# Patient Record
Sex: Female | Born: 1935 | Race: White | Hispanic: No | Marital: Married | Smoking: Former smoker
Health system: Southern US, Community
[De-identification: ages and names within clinical notes are randomized; demographics above are authoritative.]

## PROBLEM LIST (undated history)

## (undated) ENCOUNTER — Ambulatory Visit (HOSPITAL_BASED_OUTPATIENT_CLINIC_OR_DEPARTMENT_OTHER): Admission: EM | Source: Home / Self Care

## (undated) DIAGNOSIS — N009 Acute nephritic syndrome with unspecified morphologic changes: Secondary | ICD-10-CM

## (undated) DIAGNOSIS — C801 Malignant (primary) neoplasm, unspecified: Secondary | ICD-10-CM

## (undated) DIAGNOSIS — K635 Polyp of colon: Secondary | ICD-10-CM

## (undated) DIAGNOSIS — J381 Polyp of vocal cord and larynx: Secondary | ICD-10-CM

## (undated) DIAGNOSIS — Z800 Family history of malignant neoplasm of digestive organs: Secondary | ICD-10-CM

## (undated) DIAGNOSIS — J449 Chronic obstructive pulmonary disease, unspecified: Secondary | ICD-10-CM

## (undated) DIAGNOSIS — M810 Age-related osteoporosis without current pathological fracture: Secondary | ICD-10-CM

## (undated) DIAGNOSIS — I10 Essential (primary) hypertension: Secondary | ICD-10-CM

## (undated) DIAGNOSIS — I739 Peripheral vascular disease, unspecified: Secondary | ICD-10-CM

## (undated) DIAGNOSIS — E7800 Pure hypercholesterolemia, unspecified: Secondary | ICD-10-CM

## (undated) DIAGNOSIS — N189 Chronic kidney disease, unspecified: Secondary | ICD-10-CM

## (undated) HISTORY — DX: Acute nephritic syndrome with unspecified morphologic changes: N00.9

## (undated) HISTORY — DX: Essential (primary) hypertension: I10

## (undated) HISTORY — DX: Family history of malignant neoplasm of digestive organs: Z80.0

## (undated) HISTORY — DX: Polyp of colon: K63.5

## (undated) HISTORY — DX: Polyp of vocal cord and larynx: J38.1

## (undated) HISTORY — DX: Chronic kidney disease, unspecified: N18.9

## (undated) HISTORY — DX: Chronic obstructive pulmonary disease, unspecified: J44.9

## (undated) HISTORY — DX: Age-related osteoporosis without current pathological fracture: M81.0

## (undated) HISTORY — DX: Peripheral vascular disease, unspecified: I73.9

## (undated) HISTORY — PX: ABDOMINAL HYSTERECTOMY: SHX81

## (undated) HISTORY — PX: WISDOM TOOTH EXTRACTION: SHX21

## (undated) HISTORY — DX: Pure hypercholesterolemia, unspecified: E78.00

## (undated) HISTORY — PX: CATARACT EXTRACTION: SUR2

## (undated) HISTORY — DX: Malignant (primary) neoplasm, unspecified: C80.1

---
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
## 1999-11-17 HISTORY — PX: COLON SURGERY: SHX602

## 2000-04-15 ENCOUNTER — Observation Stay (HOSPITAL_COMMUNITY): Admission: EM | Admit: 2000-04-15 | Discharge: 2000-04-16 | Payer: Self-pay | Admitting: General Surgery

## 2003-11-17 HISTORY — PX: BUNIONECTOMY: SHX129

## 2008-11-16 HISTORY — PX: OTHER SURGICAL HISTORY: SHX169

## 2012-02-15 ENCOUNTER — Encounter: Payer: Self-pay | Admitting: Vascular Surgery

## 2012-02-17 ENCOUNTER — Encounter: Payer: Self-pay | Admitting: Vascular Surgery

## 2012-02-18 ENCOUNTER — Encounter: Payer: Self-pay | Admitting: Vascular Surgery

## 2012-02-18 ENCOUNTER — Ambulatory Visit (INDEPENDENT_AMBULATORY_CARE_PROVIDER_SITE_OTHER): Payer: Medicare Other | Admitting: Vascular Surgery

## 2012-02-18 VITALS — BP 149/49 | HR 62 | Temp 98.0°F | Resp 16 | Ht 61.0 in | Wt 109.0 lb

## 2012-02-18 DIAGNOSIS — I771 Stricture of artery: Secondary | ICD-10-CM | POA: Insufficient documentation

## 2012-02-18 NOTE — Progress Notes (Signed)
[facility] HISTORY AND PHYSICAL   History of Present Illness:  Patient is a 77 y.o. year old female who presents for evaluation of a right subclavian stenosis.  The patient is referred by Dr. Sudie Bailey.  The patient has been asymptomatic from this right subclavian stenosis. She actually noted this on self screening blood pressure. She noted that her right arm pressure was always lower than her left. She denies any symptoms of TIA amaurosis or stroke. She denies any numbness or tingling in her upper extremity is. She has no dizziness symptoms when using her upper extremity is. Other medical problems include tobacco abuse. She currently is not interested in quitting. She currently smokes 5 cigarettes per day. She exercises 2-3 times per week on a treadmill.  Past Medical History  Diagnosis Date  . PVD (peripheral vascular disease)   . Chronic kidney disease 1948    nehpritis     Past Surgical History  Procedure Date  . Appendectomy 1952  . Abdominal hysterectomy   . Colon surgery 2001    tumor removed  . Bunionectomy 2005  . Vocal chord nodules removed 2010     Social History History  Substance Use Topics  . Smoking status: Current Everyday Smoker -- 0.5 packs/day for 50 years    Types: Cigarettes  . Smokeless tobacco: Never Used  . Alcohol Use: Yes    Family History Family History  Problem Relation Age of Onset  . Hypertension Mother   . Heart disease Mother   . Diabetes Father   . Hypertension Father   . Heart disease Father   . Diabetes Paternal Grandmother   . Hypertension Paternal Grandmother   . Heart disease Paternal Grandmother   . Hypertension Paternal Grandfather   . Diabetes Paternal Grandfather   . Heart disease Paternal Grandfather     Allergies  Allergies  Allergen Reactions  . Latex      Current Outpatient Prescriptions  Medication Sig Dispense Refill  . aspirin 81 MG tablet Take 81 mg by mouth daily.      .  calcium-vitamin D (OSCAL WITH D) 500-200 MG-UNIT per tablet Take 3 tablets by mouth daily.      . clobetasol cream (TEMOVATE) 0.05 % Apply topically 2 (two) times daily.      . mometasone (ELOCON) 0.1 % cream Apply topically daily.        ROS:   General:  No weight loss, Fever, chills  HEENT: No recent headaches, no nasal bleeding, no visual changes, no sore throat  Neurologic: No dizziness, blackouts, seizures. No recent symptoms of stroke or mini- stroke. No recent episodes of slurred speech, or temporary blindness.  Cardiac: No recent episodes of chest pain/pressure, no shortness of breath at rest.  No shortness of breath with exertion.  Denies history of atrial fibrillation or irregular heartbeat  Vascular: No history of rest pain in feet.  No history of claudication.  No history of non-healing ulcer, No history of DVT   Pulmonary: No home oxygen, no productive cough, no hemoptysis,  No asthma or wheezing  Musculoskeletal:  [ ]  Arthritis, [ ]  Low back pain,  [ ]  Joint pain  Hematologic:No history of hypercoagulable state.  No history of easy bleeding.  No history of anemia  Gastrointestinal: No hematochezia or melena,  No gastroesophageal reflux, no trouble swallowing  Urinary: [ ]  chronic Kidney disease, [ ]  on HD - [ ]  MWF or [ ]  TTHS, [ ]  Burning with urination, [ ]   Frequent urination, [ ]  Difficulty urinating;   Skin: No rashes  Psychological: No history of anxiety,  No history of depression   Physical Examination  Filed Vitals:   02/18/12 1138  BP: 149/49  Pulse: 62  Temp: 98 F (36.7 C)  TempSrc: Oral  Resp: 16  Height: 5\' 1"  (1.549 m)  Weight: 109 lb (49.442 kg)    Body mass index is 20.60 kg/(m^2).  General:  Alert and oriented, no acute distress HEENT: Normal Neck: No bruit or JVD Pulmonary: Clear to auscultation bilaterally Cardiac: Regular Rate and Rhythm without murmur Abdomen: Soft, non-tender, non-distended, no mass, no scars Skin: No  rash Extremity Pulses:  2+ radial, brachial left side, absent right brachial and radial pulse, hand pink and warm bilaterally, 2+  femoral, dorsalis pedis  bilaterally Musculoskeletal: No deformity or edema  Neurologic: Upper and lower extremity motor 5/5 and symmetric  DATA: I reviewed her carotid duplex scan from [facility] -- [facility] dated 02/09/2012. There is no significant right internal carotid artery stenosis. There is a 40-60% left internal carotid artery stenosis. There is suggestion of a right subclavian artery stenosis based on decreased pressure on the right side as well as monophasic waveforms in the right subclavian artery   ASSESSMENT: Right subclavian artery stenosis, asymptomatic. Moderate left internal carotid artery stenosis, asymptomatic   PLAN:  Agree with Dr. Sudie Bailey that the patient should be on antiplatelet therapy in the form of aspirin. She states that she has been compliant with this. She needs a followup carotid duplex scan in 6 months. She thinks this is being scheduled through [facility].  She should always have her blood pressure measured in her left upper extremity. She will followup on as-needed basis if her screening duplex ultrasound comes greater than 80% in the internal carotid artery. Also, if she develops symptoms of subclavian stenosis which I discussed with her today. Also she will followup sooner she develops symptoms of TIA amaurosis or stroke. She would prefer to have her surveillance carotid duplex scans  for now.  Fabienne Bruns, MD [facility] Office: [phone] Pager: [phone]

## 2015-05-17 DIAGNOSIS — L0390 Cellulitis, unspecified: Secondary | ICD-10-CM | POA: Diagnosis not present

## 2015-06-17 DIAGNOSIS — Z1231 Encounter for screening mammogram for malignant neoplasm of breast: Secondary | ICD-10-CM | POA: Diagnosis not present

## 2015-06-17 DIAGNOSIS — I10 Essential (primary) hypertension: Secondary | ICD-10-CM | POA: Diagnosis not present

## 2015-06-17 DIAGNOSIS — E039 Hypothyroidism, unspecified: Secondary | ICD-10-CM | POA: Diagnosis not present

## 2015-06-17 DIAGNOSIS — E785 Hyperlipidemia, unspecified: Secondary | ICD-10-CM | POA: Diagnosis not present

## 2015-06-17 DIAGNOSIS — Z79899 Other long term (current) drug therapy: Secondary | ICD-10-CM | POA: Diagnosis not present

## 2015-06-17 DIAGNOSIS — B353 Tinea pedis: Secondary | ICD-10-CM | POA: Diagnosis not present

## 2015-06-28 DIAGNOSIS — Z1231 Encounter for screening mammogram for malignant neoplasm of breast: Secondary | ICD-10-CM | POA: Diagnosis not present

## 2015-10-23 DIAGNOSIS — H25813 Combined forms of age-related cataract, bilateral: Secondary | ICD-10-CM | POA: Diagnosis not present

## 2017-05-27 HISTORY — PX: COLONOSCOPY: SHX174

## 2018-08-29 ENCOUNTER — Encounter: Payer: Self-pay | Admitting: Gastroenterology

## 2018-08-31 ENCOUNTER — Encounter: Payer: Self-pay | Admitting: Gastroenterology

## 2018-09-02 ENCOUNTER — Ambulatory Visit: Payer: Medicare Other | Admitting: Gastroenterology

## 2018-09-05 ENCOUNTER — Encounter: Payer: Self-pay | Admitting: Gastroenterology

## 2018-09-06 ENCOUNTER — Ambulatory Visit: Payer: Medicare Other | Admitting: Gastroenterology

## 2018-09-06 ENCOUNTER — Ambulatory Visit (HOSPITAL_BASED_OUTPATIENT_CLINIC_OR_DEPARTMENT_OTHER)
Admission: RE | Admit: 2018-09-06 | Discharge: 2018-09-06 | Disposition: A | Discharge status: Home / Self Care | Payer: Medicare Other | Source: Ambulatory Visit | Attending: Gastroenterology | Admitting: Gastroenterology

## 2018-09-06 ENCOUNTER — Encounter: Payer: Self-pay | Admitting: Gastroenterology

## 2018-09-06 VITALS — BP 136/76 | HR 72 | Ht 61.5 in | Wt 99.0 lb

## 2018-09-06 DIAGNOSIS — R140 Abdominal distension (gaseous): Secondary | ICD-10-CM | POA: Diagnosis not present

## 2018-09-06 DIAGNOSIS — D71 Functional disorders of polymorphonuclear neutrophils: Secondary | ICD-10-CM | POA: Insufficient documentation

## 2018-09-06 DIAGNOSIS — R159 Full incontinence of feces: Secondary | ICD-10-CM | POA: Diagnosis not present

## 2018-09-06 DIAGNOSIS — J449 Chronic obstructive pulmonary disease, unspecified: Secondary | ICD-10-CM | POA: Insufficient documentation

## 2018-09-06 MED ORDER — ALIGN PO CAPS: 1.0000 | ORAL_CAPSULE | Freq: Every day | ORAL | 3 refills | Status: DC

## 2018-09-06 NOTE — Progress Notes (Signed)
Chief Complaint: FU  Referring Provider:  Philemon Kingdom, MD      ASSESSMENT AND PLAN;   #1. Fecal incontinence (has associated urinary incontinence). Neg colon 05/2017 except for mild sigmoid diverticulosis and highly redundant colon. #2. H/O  Bloating. #3. Family history of colon cancer (daughter) #4. 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  No recurrence.  Plan: - X-ray KUB-supine and upright today to r/o spurious diarrhea. - Add calcium 500mg  po qd. - Align 1 tab po qd. - Kegel exercises.  Information given. - If continues to have problems, would consider anorectal manometry with biofeedback. - Follow-up in 12 weeks, earlier in case of any problems.   HPI:    Carmen Lang is a 83 y.o. female  With intermittent diarrhea, associated abdominal bloating and rectal incontinence over the last few months She has previous history of constipation. No melena or hematochezia No abdominal pain or rectal pain Has associated occasional urinary incontinence Denies use of laxatives or excessive fiber intake No recent antibiotics No weight loss   Past Medical History:  Diagnosis Date  . Acute nephritis    around 1948  . Chronic kidney disease 1948   nehpritis   . Colon polyp   . COPD (chronic obstructive pulmonary disease) (HCC)    Borderline  . Elevated cholesterol   . FH: colon cancer   . High blood pressure   . Osteoporosis   . PVD (peripheral vascular disease) (HCC)   . Vocal cord polyp     Past Surgical History:  Procedure Laterality Date  . ABDOMINAL HYSTERECTOMY    . APPENDECTOMY  1952  . BUNIONECTOMY  2005  . COLON SURGERY  2001   tumor removed  . COLONOSCOPY  05/27/2017   Mild sigmoid diverticulosis. OTherwise normal colonoscopy. Colon highly redundant  . vocal chord nodules removed  2010    Family History  Problem Relation Age of Onset  . Hypertension Mother   . Heart disease Mother   . Diabetes Father     . Hypertension Father   . Heart disease Father   . Diabetes Paternal Grandmother   . Hypertension Paternal Grandmother   . Heart disease Paternal Grandmother   . Hypertension Paternal Grandfather   . Diabetes Paternal Grandfather   . Heart disease Paternal Grandfather   . Colon cancer Daughter     Social History   Tobacco Use  . Smoking status: Current Every Day Smoker    Packs/day: 0.50    Years: 50.00    Pack years: 25.00    Types: Cigarettes  . Smokeless tobacco: Never Used  . Tobacco comment: no more than 5 cigarrettes a day  Substance Use Topics  . Alcohol use: Yes    Alcohol/week: 2.0 standard drinks    Types: 2 Glasses of wine per week  . Drug use: No    Current Outpatient Medications  Medication Sig Dispense Refill  . amLODipine (NORVASC) 5 MG tablet daily.    Marland Kitchen aspirin 81 MG tablet Take 81 mg by mouth daily.    Marland Kitchen ezetimibe (ZETIA) 10 MG tablet daily.    Marland Kitchen levothyroxine (SYNTHROID, LEVOTHROID) 25 MCG tablet daily.    . mometasone (ELOCON) 0.1 % cream Apply topically daily.    . solifenacin (VESICARE) 5 MG tablet daily.    . clobetasol cream (TEMOVATE) 0.05 % Apply 1 application topically as needed.      No current facility-administered medications for this visit.     Allergies  Allergen Reactions  . Latex   . Statins Other (See Comments)    Other    Review of Systems:  Negative except for HPI     Physical Exam:    BP 136/76   Pulse 72   Ht 5' 1.5" (1.562 m)   Wt 99 lb (44.9 kg)   BMI 18.40 kg/m  Filed Weights   09/06/18 1449  Weight: 99 lb (44.9 kg)   Constitutional:  Well-developed, in no acute distress. Psychiatric: Normal mood and affect. Behavior is normal. HEENT: Pupils normal.  Conjunctivae are normal. No scleral icterus. Neck supple.  Cardiovascular: Normal rate, regular rhythm. No edema Pulmonary/chest: Bilateral decreased breath sounds. Abdominal: Soft, nondistended. Nontender. Bowel sounds active throughout. There are no  masses palpable. No hepatomegaly. Rectal: Decreased rectal tone, hard stool, heme-negative. Seen in presence of Woodroe Mode CMA Neurological: Alert and oriented to person place and time. Skin: Skin is warm and dry. No rashes noted. 25 minutes spent with the patient today. Greater than 50% was spent in counseling and coordination of care with the patient    Edman Circle, MD 09/06/2018, 3:09 PM  Cc: Philemon Kingdom, MD

## 2018-09-06 NOTE — Patient Instructions (Signed)
If you are age 81 or older, your body mass index should be between 23-30. Your Body mass index is 18.4 kg/m. If this is out of the aforementioned range listed, please consider follow up with your Primary Care Provider.  If you are age 59 or younger, your body mass index should be between 19-25. Your Body mass index is 18.4 kg/m. If this is out of the aformentioned range listed, please consider follow up with your Primary Care Provider.   Your provider has requested that you have an abdominal x ray before leaving today. Please go to the1st floor to our Radiology department for the test.   We have sent the following medications to your pharmacy for you to pick up at your convenience: Align  Please purchase the following medications over the counter and take as directed: Calcium     Kegel exercises: A how-to guide for women Kegel exercises can prevent or control urinary incontinence and other pelvic floor problems. Here's a step-by-step guide to doing Kegel exercises correctly.  Kegel exercises strengthen the pelvic floor muscles, which support the uterus, bladder, small intestine and rectum. You can do Kegel exercises, also known as pelvic floor muscle training, just about anytime. Start by understanding what Kegel exercises can do for you - then follow these instructions for contracting and relaxing your pelvic floor muscles. Why Kegel exercises matter The pelvic floor muscles work like a hammock to support the pelvic organs, including the uterus, bladder and rectum. Kegel exercises can help strengthen these muscles. "  The pelvic floor muscles work like a hammock to support the pelvic organs, including the uterus, bladder and rectum. Kegel exercises can help strengthen these muscles. "  Female pelvic floor muscles  Many factors can weaken your pelvic floor muscles, including pregnancy, childbirth, surgery, aging, excessive straining from constipation or chronic coughing, and being  overweight. You might benefit from doing Kegel exercises if you: Leak a few drops of urine while sneezing, laughing or coughing (stress incontinence)  Have a strong, sudden urge to urinate just before losing a large amount of urine (urinary urge incontinence)  Leak stool (fecal incontinence) Kegel exercises can also be done during pregnancy or after childbirth to try to improve your symptoms. Kegel exercises are less helpful for women who have severe urine leakage when they sneeze, cough or laugh. Also, Kegel exercises aren't helpful for women who unexpectedly leak small amounts of urine due to a full bladder (overflow incontinence). How to do Kegel exercises To get started: Find the right muscles. To identify your pelvic floor muscles, stop urination in midstream. Once you've identified your pelvic floor muscles you can do the exercises in any position, although you might find it easiest to do them lying down at first.  Perfect your technique. To do Kegels, imagine you are sitting on a marble and tighten your pelvic muscles as if you're lifting the marble. Try it for three seconds at a time, then relax for a count of three.  Maintain your focus. For best results, focus on tightening only your pelvic floor muscles. Be careful not to flex the muscles in your abdomen, thighs or buttocks. Avoid holding your breath. Instead, breathe freely during the exercises.  Repeat three times a day. Aim for at least three sets of 10 to 15 repetitions a day. Don't make a habit of using Kegel exercises to start and stop your urine stream. Doing Kegel exercises while emptying your bladder can actually lead to incomplete emptying of the bladder -  which increases the risk of a urinary tract infection. When to do your Kegels Make Kegel exercises part of your daily routine. You can do Kegel exercises discreetly just about any time, whether you're sitting at your desk or relaxing on the couch. When you're having trouble If  you're having trouble doing Kegel exercises, don't be embarrassed to ask for help. Your doctor or other health care provider can give you important feedback so that you learn to isolate and exercise the correct muscles. In some cases, vaginal weighted cones or biofeedback might help. To use a vaginal cone, you insert it into your vagina and use pelvic muscle contractions to hold it in place during your daily activities. During a biofeedback session, your doctor or other health care provider inserts a pressure sensor into your vagina or rectum. As you relax and contract your pelvic floor muscles, a monitor will measure and display your pelvic floor activity. When to expect results If you do Kegel exercises regularly, you can expect results - such as less frequent urine leakage - within about a few weeks to a few months. For continued benefits, make Kegel exercises a permanent part of your daily routine.     Thank you,  Dr. Lynann Bologna

## 2018-09-13 ENCOUNTER — Telehealth: Payer: Self-pay | Admitting: Gastroenterology

## 2018-09-13 NOTE — Telephone Encounter (Signed)
Pharmacy calling regarding prescription for align. They said that in order for insurance to approve prescription we need to contact insurance company.

## 2018-09-13 NOTE — Telephone Encounter (Signed)
I have sent a piror authorization in on this medication.

## 2018-10-04 ENCOUNTER — Telehealth: Payer: Self-pay | Admitting: Gastroenterology

## 2018-10-05 MED ORDER — ALIGN PO CAPS: 1.0000 | ORAL_CAPSULE | Freq: Every day | ORAL | 3 refills | Status: DC

## 2018-10-05 NOTE — Telephone Encounter (Signed)
Sent prescription to patients pharmacy, patient notified. 

## 2019-09-22 IMAGING — DX DG ABDOMEN 2V
3 series · 3 of 3 positions shown · non-contrast
Comparison: None

CLINICAL DATA: Fecal incontinence, bloating, history COPD,
hypertension, chronic kidney disease, smoker

EXAM:
ABDOMEN - 2 VIEW

[abdomen erect]
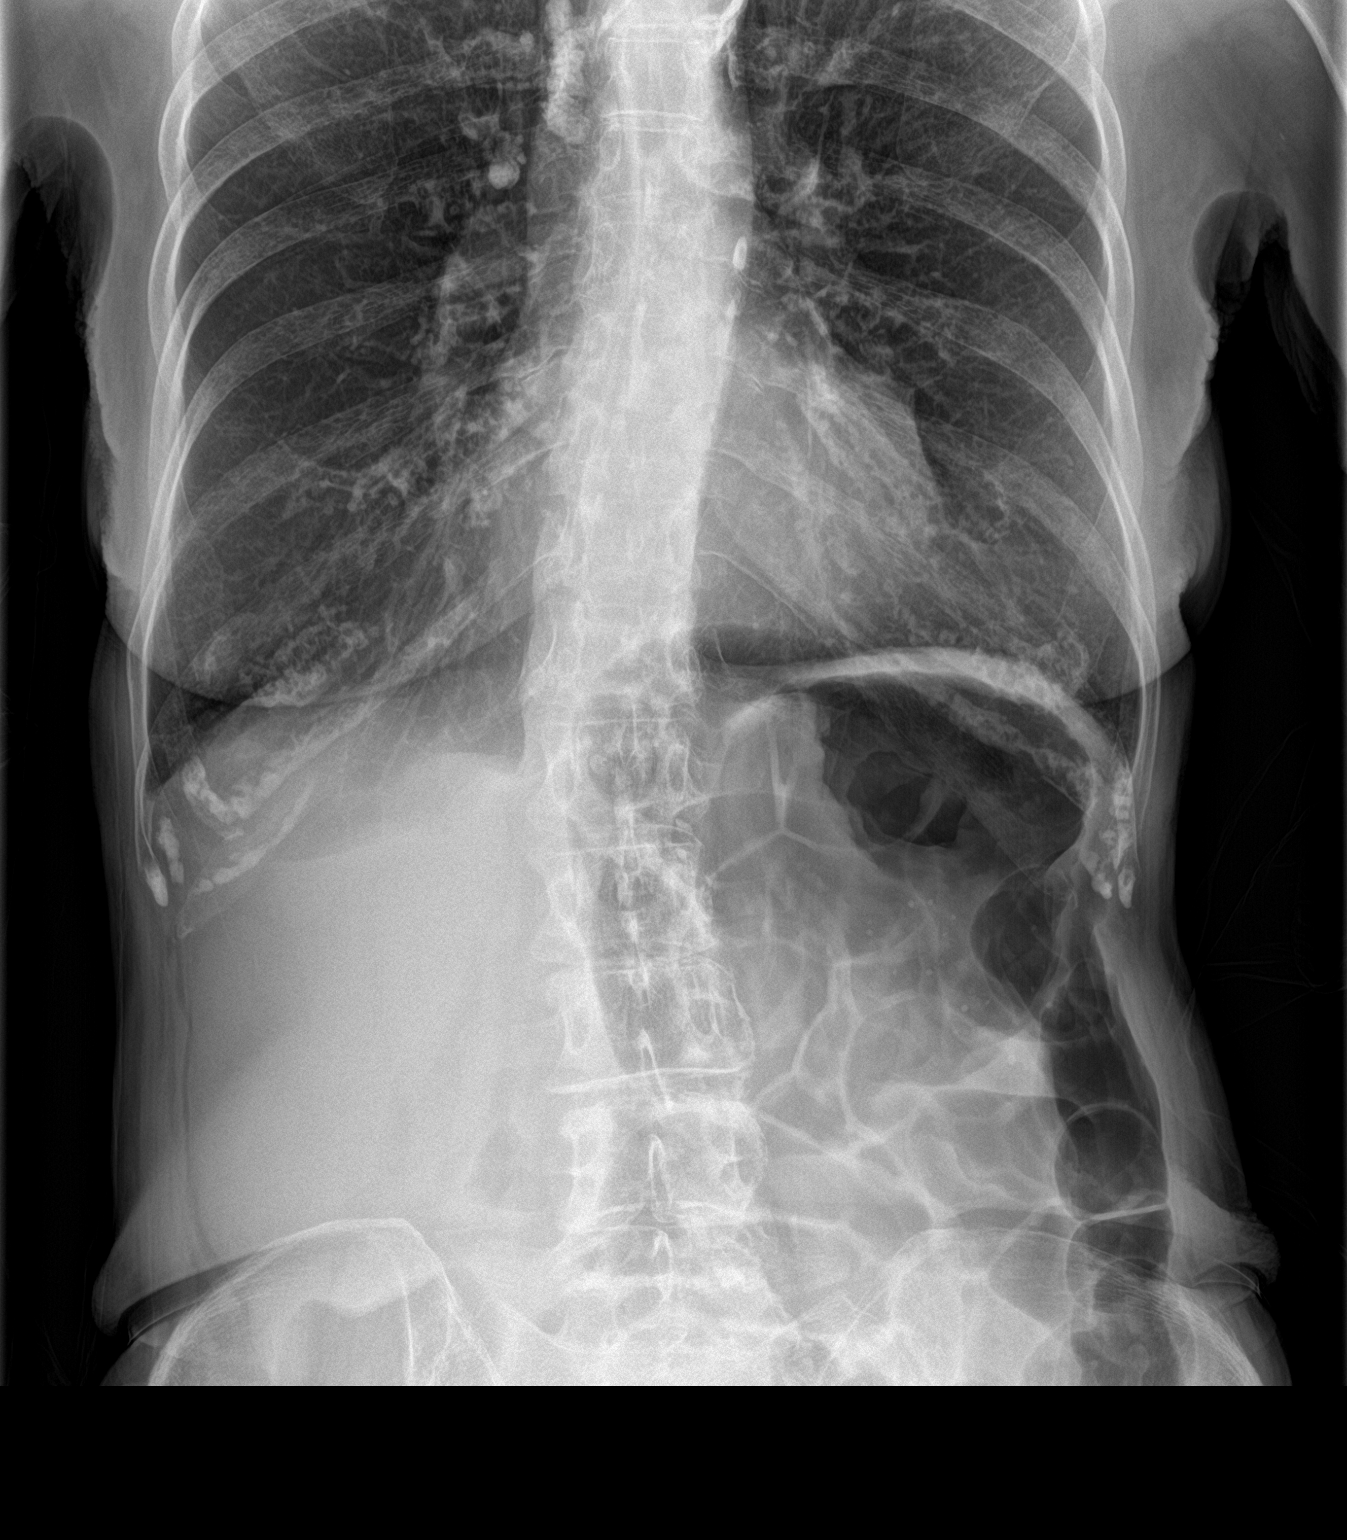

[abdomen supine (1 of 2)]
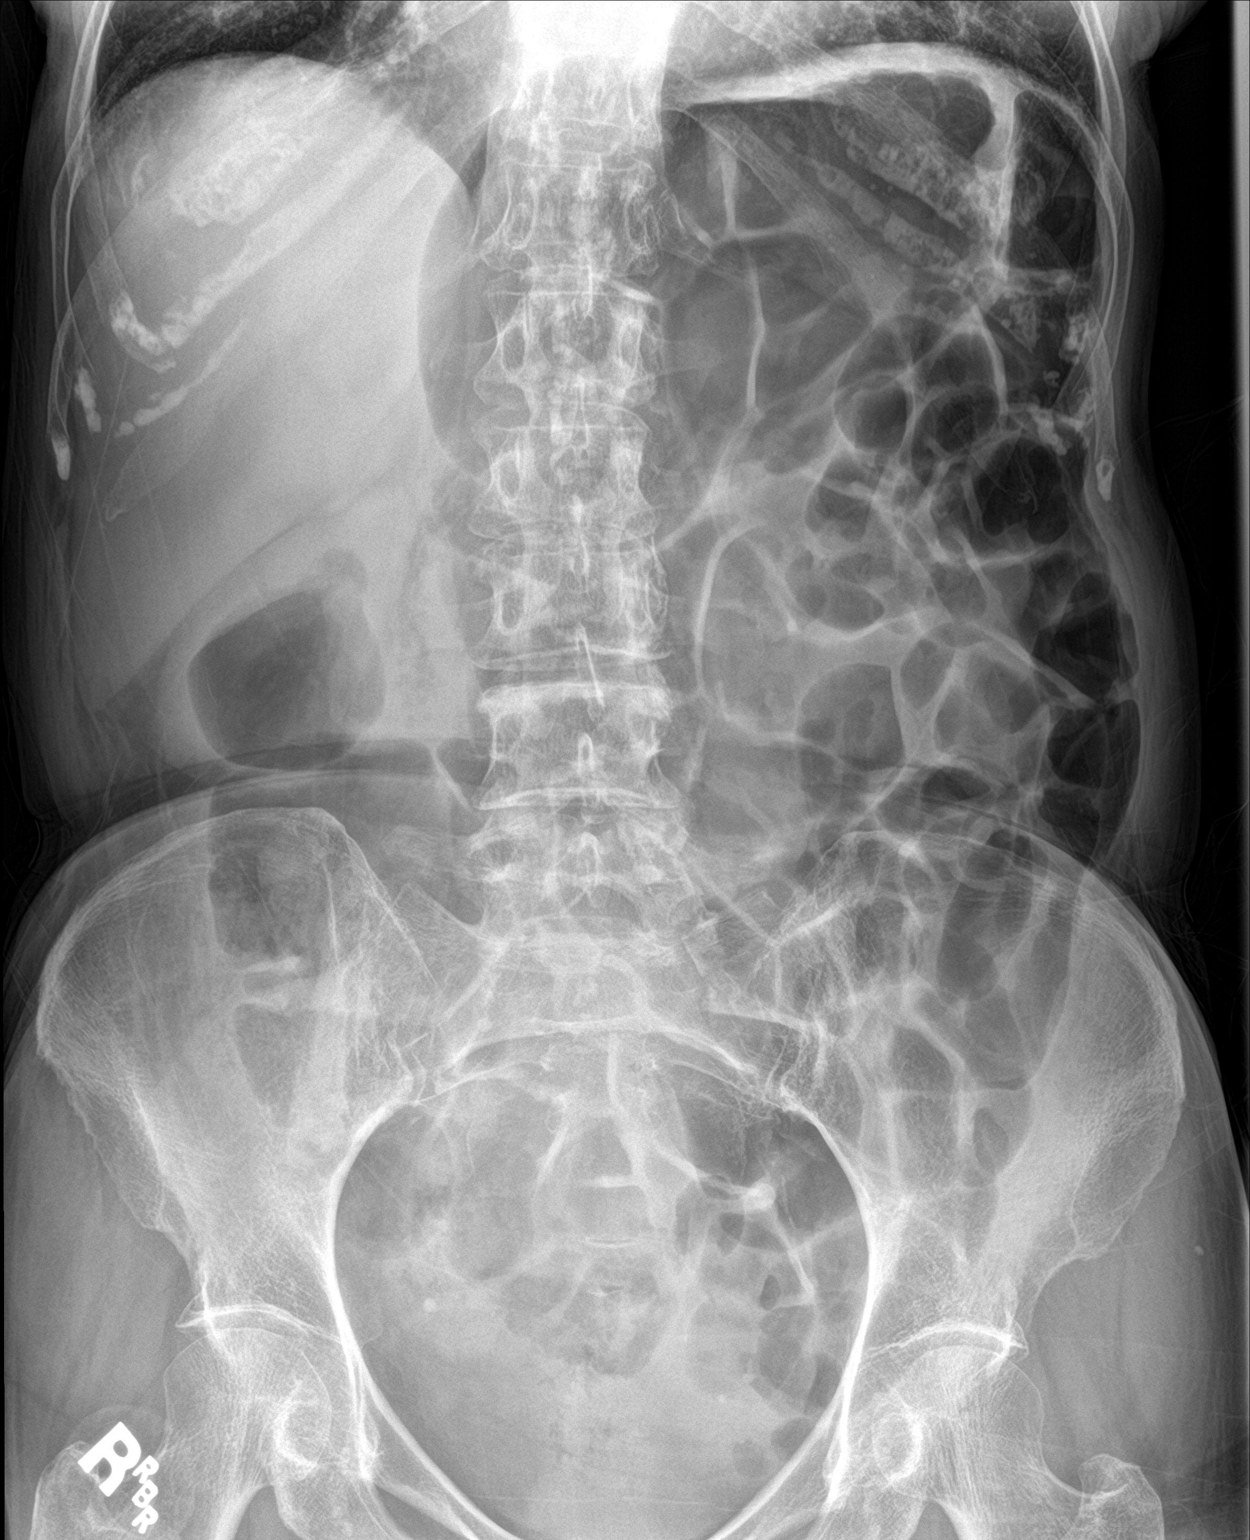

[abdomen supine (2 of 2)]
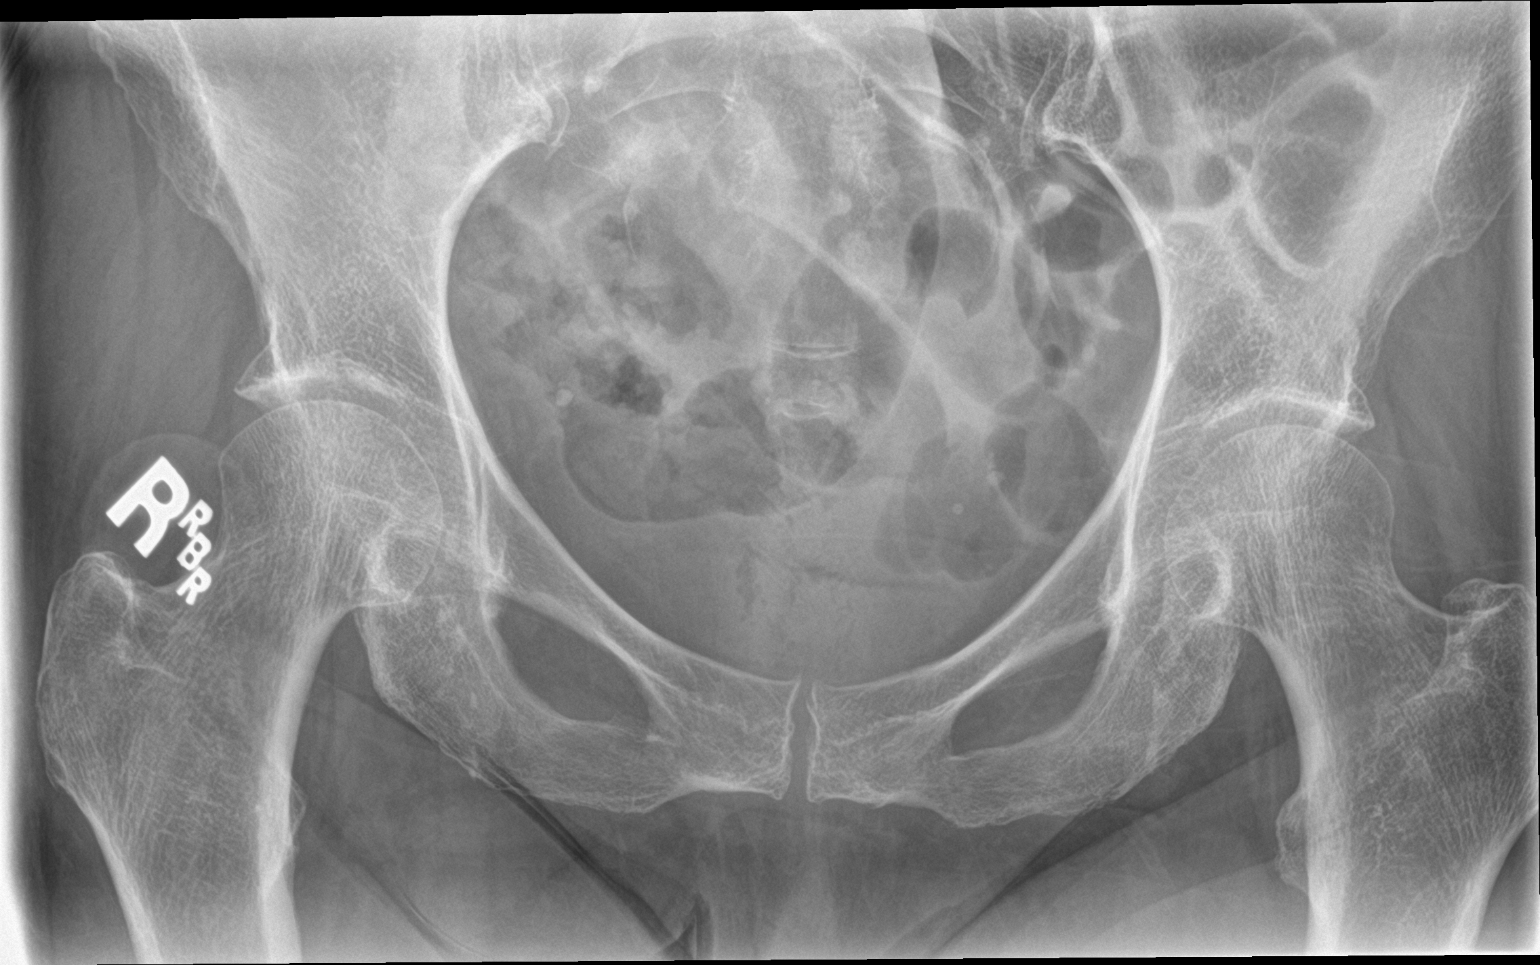

[3 of 3 positions shown; findings below may reference images not displayed]

FINDINGS: Lung bases emphysematous but clear.

Multiple LEFT upper quadrant calcifications question calcified
splenic granulomata.

Air-filled loops of large and small bowel throughout abdomen.

No definite evidence of bowel obstruction, bowel wall thickening or
free air.

Bones demineralized with thoracolumbar scoliosis.

Atherosclerotic calcifications aorta.
IMPRESSION: Nonobstructive bowel gas pattern.

COPD changes without basilar infiltrate.

Old granulomatous disease of the spleen.

## 2019-11-24 DIAGNOSIS — I6523 Occlusion and stenosis of bilateral carotid arteries: Secondary | ICD-10-CM | POA: Diagnosis not present

## 2020-05-23 DIAGNOSIS — E785 Hyperlipidemia, unspecified: Secondary | ICD-10-CM | POA: Diagnosis not present

## 2020-05-23 DIAGNOSIS — R413 Other amnesia: Secondary | ICD-10-CM | POA: Diagnosis not present

## 2020-05-23 DIAGNOSIS — E559 Vitamin D deficiency, unspecified: Secondary | ICD-10-CM | POA: Diagnosis not present

## 2020-05-23 DIAGNOSIS — I10 Essential (primary) hypertension: Secondary | ICD-10-CM | POA: Diagnosis not present

## 2020-05-23 DIAGNOSIS — I739 Peripheral vascular disease, unspecified: Secondary | ICD-10-CM | POA: Diagnosis not present

## 2020-05-23 DIAGNOSIS — M810 Age-related osteoporosis without current pathological fracture: Secondary | ICD-10-CM | POA: Diagnosis not present

## 2020-05-23 DIAGNOSIS — Z79899 Other long term (current) drug therapy: Secondary | ICD-10-CM | POA: Diagnosis not present

## 2020-05-23 DIAGNOSIS — E039 Hypothyroidism, unspecified: Secondary | ICD-10-CM | POA: Diagnosis not present

## 2020-06-12 DIAGNOSIS — I6381 Other cerebral infarction due to occlusion or stenosis of small artery: Secondary | ICD-10-CM | POA: Diagnosis not present

## 2020-06-12 DIAGNOSIS — G319 Degenerative disease of nervous system, unspecified: Secondary | ICD-10-CM | POA: Diagnosis not present

## 2020-06-12 DIAGNOSIS — I6782 Cerebral ischemia: Secondary | ICD-10-CM | POA: Diagnosis not present

## 2020-06-12 DIAGNOSIS — J320 Chronic maxillary sinusitis: Secondary | ICD-10-CM | POA: Diagnosis not present

## 2020-06-12 DIAGNOSIS — R413 Other amnesia: Secondary | ICD-10-CM | POA: Diagnosis not present

## 2020-07-01 DIAGNOSIS — J320 Chronic maxillary sinusitis: Secondary | ICD-10-CM | POA: Diagnosis not present

## 2020-07-01 DIAGNOSIS — R413 Other amnesia: Secondary | ICD-10-CM | POA: Diagnosis not present

## 2020-07-01 DIAGNOSIS — E785 Hyperlipidemia, unspecified: Secondary | ICD-10-CM | POA: Diagnosis not present

## 2020-07-01 DIAGNOSIS — Z1331 Encounter for screening for depression: Secondary | ICD-10-CM | POA: Diagnosis not present

## 2020-09-05 DIAGNOSIS — H35413 Lattice degeneration of retina, bilateral: Secondary | ICD-10-CM | POA: Diagnosis not present

## 2020-09-05 DIAGNOSIS — Z961 Presence of intraocular lens: Secondary | ICD-10-CM | POA: Diagnosis not present

## 2020-12-16 DIAGNOSIS — N3941 Urge incontinence: Secondary | ICD-10-CM | POA: Diagnosis not present

## 2020-12-16 DIAGNOSIS — E039 Hypothyroidism, unspecified: Secondary | ICD-10-CM | POA: Diagnosis not present

## 2020-12-16 DIAGNOSIS — Z79899 Other long term (current) drug therapy: Secondary | ICD-10-CM | POA: Diagnosis not present

## 2020-12-16 DIAGNOSIS — Z0000 Encounter for general adult medical examination without abnormal findings: Secondary | ICD-10-CM | POA: Diagnosis not present

## 2020-12-16 DIAGNOSIS — Z1331 Encounter for screening for depression: Secondary | ICD-10-CM | POA: Diagnosis not present

## 2020-12-16 DIAGNOSIS — Z1339 Encounter for screening examination for other mental health and behavioral disorders: Secondary | ICD-10-CM | POA: Diagnosis not present

## 2020-12-16 DIAGNOSIS — Z1231 Encounter for screening mammogram for malignant neoplasm of breast: Secondary | ICD-10-CM | POA: Diagnosis not present

## 2020-12-16 DIAGNOSIS — E785 Hyperlipidemia, unspecified: Secondary | ICD-10-CM | POA: Diagnosis not present

## 2020-12-16 DIAGNOSIS — L84 Corns and callosities: Secondary | ICD-10-CM | POA: Diagnosis not present

## 2020-12-16 DIAGNOSIS — I10 Essential (primary) hypertension: Secondary | ICD-10-CM | POA: Diagnosis not present

## 2021-01-21 DIAGNOSIS — Z1231 Encounter for screening mammogram for malignant neoplasm of breast: Secondary | ICD-10-CM | POA: Diagnosis not present

## 2021-02-25 DIAGNOSIS — R928 Other abnormal and inconclusive findings on diagnostic imaging of breast: Secondary | ICD-10-CM | POA: Diagnosis not present

## 2021-02-25 DIAGNOSIS — R922 Inconclusive mammogram: Secondary | ICD-10-CM | POA: Diagnosis not present

## 2021-04-15 DIAGNOSIS — N3941 Urge incontinence: Secondary | ICD-10-CM | POA: Diagnosis not present

## 2021-04-15 DIAGNOSIS — I10 Essential (primary) hypertension: Secondary | ICD-10-CM | POA: Diagnosis not present

## 2021-04-15 DIAGNOSIS — E785 Hyperlipidemia, unspecified: Secondary | ICD-10-CM | POA: Diagnosis not present

## 2021-04-15 DIAGNOSIS — E039 Hypothyroidism, unspecified: Secondary | ICD-10-CM | POA: Diagnosis not present

## 2021-04-15 DIAGNOSIS — F0390 Unspecified dementia without behavioral disturbance: Secondary | ICD-10-CM | POA: Diagnosis not present

## 2021-07-07 DIAGNOSIS — K56609 Unspecified intestinal obstruction, unspecified as to partial versus complete obstruction: Secondary | ICD-10-CM | POA: Diagnosis not present

## 2021-07-07 DIAGNOSIS — R109 Unspecified abdominal pain: Secondary | ICD-10-CM | POA: Diagnosis not present

## 2021-07-08 DIAGNOSIS — E039 Hypothyroidism, unspecified: Secondary | ICD-10-CM | POA: Diagnosis not present

## 2021-07-08 DIAGNOSIS — F17210 Nicotine dependence, cigarettes, uncomplicated: Secondary | ICD-10-CM | POA: Diagnosis not present

## 2021-07-08 DIAGNOSIS — D72829 Elevated white blood cell count, unspecified: Secondary | ICD-10-CM | POA: Diagnosis not present

## 2021-07-08 DIAGNOSIS — I10 Essential (primary) hypertension: Secondary | ICD-10-CM | POA: Diagnosis not present

## 2021-07-08 DIAGNOSIS — R109 Unspecified abdominal pain: Secondary | ICD-10-CM | POA: Diagnosis not present

## 2021-07-08 DIAGNOSIS — Z681 Body mass index (BMI) 19 or less, adult: Secondary | ICD-10-CM | POA: Diagnosis not present

## 2021-07-08 DIAGNOSIS — E43 Unspecified severe protein-calorie malnutrition: Secondary | ICD-10-CM | POA: Diagnosis not present

## 2021-07-08 DIAGNOSIS — K56609 Unspecified intestinal obstruction, unspecified as to partial versus complete obstruction: Secondary | ICD-10-CM | POA: Diagnosis not present

## 2021-07-08 DIAGNOSIS — K562 Volvulus: Secondary | ICD-10-CM | POA: Diagnosis not present

## 2021-07-08 DIAGNOSIS — K6389 Other specified diseases of intestine: Secondary | ICD-10-CM | POA: Diagnosis not present

## 2021-07-08 DIAGNOSIS — F0390 Unspecified dementia without behavioral disturbance: Secondary | ICD-10-CM | POA: Diagnosis not present

## 2021-07-08 DIAGNOSIS — E785 Hyperlipidemia, unspecified: Secondary | ICD-10-CM | POA: Diagnosis not present

## 2021-07-16 ENCOUNTER — Other Ambulatory Visit: Payer: Self-pay

## 2021-07-16 NOTE — Patient Outreach (Signed)
[facility]) Care Management  07/16/2021  Anneliese Leblond [birth year] [id number]   Referral Date: 07/16/21 Referral Source: PING  Date of Discharge:07/12/21 Facility: [facility] Insurance: [facility] attempt:No answer.  HIPAA compliant voice message left.     Plan: RN CM will attempt patient again within 4 business days and send letter.   Bary Leriche, RN, MSN [facility] Care Management Care Management Coordinator Direct Line [phone] Toll Free: [phone]  Fax: [phone]

## 2021-07-17 ENCOUNTER — Other Ambulatory Visit: Payer: Self-pay

## 2021-07-17 NOTE — Patient Outreach (Signed)
[facility]) Care Management  07/17/2021  Carmen Lang 09/29/1935 [id number]   Referral Date: 07/16/21 Referral Source: PING  Date of Discharge:07/12/21 Facility: [facility] Insurance: [facility] attempt:No answer.  HIPAA compliant voice message left.       Plan: RN CM will attempt patient again within 4 business days.  Bary Leriche, RN, MSN [facility] Care Management Care Management Coordinator Direct Line [phone] Cell [phone] Toll Free: [phone]  Fax: [phone]

## 2021-07-22 ENCOUNTER — Other Ambulatory Visit: Payer: Self-pay

## 2021-07-22 NOTE — Patient Outreach (Signed)
[facility]) Care Management  07/22/2021  Rollande Thursby 1935-01-18 [id number]   Referral Date: 07/16/21 Referral Source: PING  Date of Discharge:07/12/21 Facility: [facility] Insurance: [facility] attempt:No answer.  HIPAA compliant voice message left.       Plan: RN CM will attempt patient again in 3 weeks.  Bary Leriche, RN, MSN [facility] Care Management Care Management Coordinator Direct Line [phone] Cell [phone] Toll Free: [phone]  Fax: [phone]

## 2021-08-06 DIAGNOSIS — Z79899 Other long term (current) drug therapy: Secondary | ICD-10-CM | POA: Diagnosis not present

## 2021-08-06 DIAGNOSIS — I739 Peripheral vascular disease, unspecified: Secondary | ICD-10-CM | POA: Diagnosis not present

## 2021-08-06 DIAGNOSIS — I10 Essential (primary) hypertension: Secondary | ICD-10-CM | POA: Diagnosis not present

## 2021-08-06 DIAGNOSIS — E039 Hypothyroidism, unspecified: Secondary | ICD-10-CM | POA: Diagnosis not present

## 2021-08-06 DIAGNOSIS — E785 Hyperlipidemia, unspecified: Secondary | ICD-10-CM | POA: Diagnosis not present

## 2021-08-06 DIAGNOSIS — F0390 Unspecified dementia without behavioral disturbance: Secondary | ICD-10-CM | POA: Diagnosis not present

## 2021-08-11 ENCOUNTER — Other Ambulatory Visit: Payer: Self-pay

## 2021-08-11 NOTE — Patient Outreach (Signed)
[facility]) Care Management  08/11/2021  Chantrice Hagg Sep 19, 1935 [id number]   Referral Date: 07/16/21 Referral Source: PING  Date of Discharge:07/12/21 Facility: [facility] Insurance: [facility] attempt:No answer.  HIPAA compliant voice message left.       Plan: RN CM will close case.    Bary Leriche, RN, MSN [facility] Care Management Care Management Coordinator Direct Line [phone] Cell [phone] Toll Free: [phone]  Fax: [phone]

## 2022-02-02 DIAGNOSIS — Z1231 Encounter for screening mammogram for malignant neoplasm of breast: Secondary | ICD-10-CM | POA: Diagnosis not present

## 2022-02-02 DIAGNOSIS — Z681 Body mass index (BMI) 19 or less, adult: Secondary | ICD-10-CM | POA: Diagnosis not present

## 2022-02-02 DIAGNOSIS — N3941 Urge incontinence: Secondary | ICD-10-CM | POA: Diagnosis not present

## 2022-02-02 DIAGNOSIS — Z1331 Encounter for screening for depression: Secondary | ICD-10-CM | POA: Diagnosis not present

## 2022-02-02 DIAGNOSIS — Z0001 Encounter for general adult medical examination with abnormal findings: Secondary | ICD-10-CM | POA: Diagnosis not present

## 2022-02-02 DIAGNOSIS — Z1339 Encounter for screening examination for other mental health and behavioral disorders: Secondary | ICD-10-CM | POA: Diagnosis not present

## 2022-02-02 DIAGNOSIS — E2839 Other primary ovarian failure: Secondary | ICD-10-CM | POA: Diagnosis not present

## 2022-02-02 DIAGNOSIS — Z79899 Other long term (current) drug therapy: Secondary | ICD-10-CM | POA: Diagnosis not present

## 2022-02-02 DIAGNOSIS — E039 Hypothyroidism, unspecified: Secondary | ICD-10-CM | POA: Diagnosis not present

## 2022-02-27 DIAGNOSIS — Z1231 Encounter for screening mammogram for malignant neoplasm of breast: Secondary | ICD-10-CM | POA: Diagnosis not present

## 2022-02-27 DIAGNOSIS — E2839 Other primary ovarian failure: Secondary | ICD-10-CM | POA: Diagnosis not present

## 2022-02-27 DIAGNOSIS — M8589 Other specified disorders of bone density and structure, multiple sites: Secondary | ICD-10-CM | POA: Diagnosis not present

## 2022-07-30 DIAGNOSIS — Z961 Presence of intraocular lens: Secondary | ICD-10-CM | POA: Diagnosis not present

## 2022-08-18 DIAGNOSIS — Z122 Encounter for screening for malignant neoplasm of respiratory organs: Secondary | ICD-10-CM | POA: Diagnosis not present

## 2022-08-18 DIAGNOSIS — E039 Hypothyroidism, unspecified: Secondary | ICD-10-CM | POA: Diagnosis not present

## 2022-08-18 DIAGNOSIS — E441 Mild protein-calorie malnutrition: Secondary | ICD-10-CM | POA: Diagnosis not present

## 2022-08-18 DIAGNOSIS — E785 Hyperlipidemia, unspecified: Secondary | ICD-10-CM | POA: Diagnosis not present

## 2022-08-18 DIAGNOSIS — Z79899 Other long term (current) drug therapy: Secondary | ICD-10-CM | POA: Diagnosis not present

## 2022-08-18 DIAGNOSIS — I10 Essential (primary) hypertension: Secondary | ICD-10-CM | POA: Diagnosis not present

## 2022-08-18 DIAGNOSIS — F0390 Unspecified dementia without behavioral disturbance: Secondary | ICD-10-CM | POA: Diagnosis not present

## 2022-10-22 DIAGNOSIS — E039 Hypothyroidism, unspecified: Secondary | ICD-10-CM | POA: Diagnosis not present

## 2022-11-02 DIAGNOSIS — R2232 Localized swelling, mass and lump, left upper limb: Secondary | ICD-10-CM | POA: Diagnosis not present

## 2022-11-18 DIAGNOSIS — R922 Inconclusive mammogram: Secondary | ICD-10-CM | POA: Diagnosis not present

## 2022-11-18 DIAGNOSIS — N6002 Solitary cyst of left breast: Secondary | ICD-10-CM | POA: Diagnosis not present

## 2023-03-02 DIAGNOSIS — E039 Hypothyroidism, unspecified: Secondary | ICD-10-CM | POA: Diagnosis not present

## 2023-03-02 DIAGNOSIS — Z0000 Encounter for general adult medical examination without abnormal findings: Secondary | ICD-10-CM | POA: Diagnosis not present

## 2023-03-02 DIAGNOSIS — E785 Hyperlipidemia, unspecified: Secondary | ICD-10-CM | POA: Diagnosis not present

## 2023-03-02 DIAGNOSIS — N3941 Urge incontinence: Secondary | ICD-10-CM | POA: Diagnosis not present

## 2023-03-02 DIAGNOSIS — Z681 Body mass index (BMI) 19 or less, adult: Secondary | ICD-10-CM | POA: Diagnosis not present

## 2023-03-02 DIAGNOSIS — I10 Essential (primary) hypertension: Secondary | ICD-10-CM | POA: Diagnosis not present

## 2023-03-02 DIAGNOSIS — Z79899 Other long term (current) drug therapy: Secondary | ICD-10-CM | POA: Diagnosis not present

## 2023-03-02 DIAGNOSIS — I739 Peripheral vascular disease, unspecified: Secondary | ICD-10-CM | POA: Diagnosis not present

## 2023-03-02 DIAGNOSIS — G309 Alzheimer's disease, unspecified: Secondary | ICD-10-CM | POA: Diagnosis not present

## 2023-04-15 DIAGNOSIS — G309 Alzheimer's disease, unspecified: Secondary | ICD-10-CM | POA: Diagnosis not present

## 2023-04-15 DIAGNOSIS — G319 Degenerative disease of nervous system, unspecified: Secondary | ICD-10-CM | POA: Diagnosis not present

## 2023-04-15 DIAGNOSIS — Z681 Body mass index (BMI) 19 or less, adult: Secondary | ICD-10-CM | POA: Diagnosis not present

## 2023-05-11 DIAGNOSIS — F10920 Alcohol use, unspecified with intoxication, uncomplicated: Secondary | ICD-10-CM | POA: Diagnosis not present

## 2023-05-11 DIAGNOSIS — F17210 Nicotine dependence, cigarettes, uncomplicated: Secondary | ICD-10-CM | POA: Diagnosis not present

## 2023-05-11 DIAGNOSIS — R9082 White matter disease, unspecified: Secondary | ICD-10-CM | POA: Diagnosis not present

## 2023-05-11 DIAGNOSIS — Z7982 Long term (current) use of aspirin: Secondary | ICD-10-CM | POA: Diagnosis not present

## 2023-05-11 DIAGNOSIS — I2510 Atherosclerotic heart disease of native coronary artery without angina pectoris: Secondary | ICD-10-CM | POA: Diagnosis not present

## 2023-05-11 DIAGNOSIS — W19XXXA Unspecified fall, initial encounter: Secondary | ICD-10-CM | POA: Diagnosis not present

## 2023-05-11 DIAGNOSIS — I10 Essential (primary) hypertension: Secondary | ICD-10-CM | POA: Diagnosis not present

## 2023-05-11 DIAGNOSIS — F0390 Unspecified dementia without behavioral disturbance: Secondary | ICD-10-CM | POA: Diagnosis not present

## 2023-05-11 DIAGNOSIS — J449 Chronic obstructive pulmonary disease, unspecified: Secondary | ICD-10-CM | POA: Diagnosis not present

## 2023-05-11 DIAGNOSIS — Z79899 Other long term (current) drug therapy: Secondary | ICD-10-CM | POA: Diagnosis not present

## 2023-05-24 ENCOUNTER — Telehealth: Payer: Self-pay

## 2023-05-24 NOTE — Telephone Encounter (Signed)
Transition Care Management Follow-up Telephone Call Date of discharge and from where: [facility] 6/26 How have you been since you were released from the hospital? Doing fine  Any questions or concerns? No  Items Reviewed: Did the pt receive and understand the discharge instructions provided? Yes  Medications obtained and verified? Yes  Other? No  Any new allergies since your discharge? No  Dietary orders reviewed? No Do you have support at home? Yes    Follow up appointments reviewed:  PCP Hospital f/u appt confirmed? Yes  Scheduled to see pcp on the phone  @ . Specialist Hospital f/u appt confirmed? No  Scheduled to see  on  @ . Are transportation arrangements needed? No  If their condition worsens, is the pt aware to call PCP or go to the Emergency Dept.? Yes Was the patient provided with contact information for the PCP's office or ED? Yes Was to pt encouraged to call back with questions or concerns? Yes

## 2023-06-14 DIAGNOSIS — E785 Hyperlipidemia, unspecified: Secondary | ICD-10-CM | POA: Diagnosis not present

## 2023-06-14 DIAGNOSIS — Z681 Body mass index (BMI) 19 or less, adult: Secondary | ICD-10-CM | POA: Diagnosis not present

## 2023-06-14 DIAGNOSIS — G309 Alzheimer's disease, unspecified: Secondary | ICD-10-CM | POA: Diagnosis not present

## 2023-06-14 DIAGNOSIS — I10 Essential (primary) hypertension: Secondary | ICD-10-CM | POA: Diagnosis not present

## 2023-06-14 DIAGNOSIS — M549 Dorsalgia, unspecified: Secondary | ICD-10-CM | POA: Diagnosis not present

## 2023-06-14 DIAGNOSIS — E039 Hypothyroidism, unspecified: Secondary | ICD-10-CM | POA: Diagnosis not present

## 2023-07-14 ENCOUNTER — Telehealth: Payer: Self-pay | Admitting: Neurology

## 2023-07-14 ENCOUNTER — Ambulatory Visit: Payer: Medicare PPO | Admitting: Neurology

## 2023-07-14 ENCOUNTER — Other Ambulatory Visit: Payer: Self-pay | Admitting: Neurology

## 2023-07-14 ENCOUNTER — Encounter: Payer: Self-pay | Admitting: Neurology

## 2023-07-14 VITALS — BP 133/49 | HR 75 | Ht 62.0 in | Wt 81.2 lb

## 2023-07-14 DIAGNOSIS — F02B0 Dementia in other diseases classified elsewhere, moderate, without behavioral disturbance, psychotic disturbance, mood disturbance, and anxiety: Secondary | ICD-10-CM

## 2023-07-14 DIAGNOSIS — F0390 Unspecified dementia without behavioral disturbance: Secondary | ICD-10-CM | POA: Insufficient documentation

## 2023-07-14 DIAGNOSIS — F0280 Dementia in other diseases classified elsewhere without behavioral disturbance: Secondary | ICD-10-CM

## 2023-07-14 DIAGNOSIS — G309 Alzheimer's disease, unspecified: Secondary | ICD-10-CM | POA: Diagnosis not present

## 2023-07-14 MED ORDER — DONEPEZIL HCL 5 MG PO TABS: 5.0000 mg | ORAL_TABLET | Freq: Every day | ORAL | 5 refills | Status: DC

## 2023-07-14 NOTE — Telephone Encounter (Signed)
[place] at pharmacy is asking for a call to claify the Rx for pt's  donepezil (ARICEPT) 5 MG tablet If [place] isnt available ask for Aundra Millet which is the pharmacist

## 2023-07-14 NOTE — Progress Notes (Addendum)
[facility]  Provider:  Dr Aleyna Cueva Referring Provider: Philemon Kingdom, MD Primary Care Physician:  Philemon Kingdom, MD  Chief Complaint  Patient presents with   New Patient (Initial Visit)    NEW Patient in room #1 with husband. Patient to be evalauted for memory issues.    HPI:  Carmen Lang is a 88 y.o. female and seen here upon referral from Dr. Sudie Bailey for a Consultation/ Evaluation of Dementia .  This patient's husband of 60 years reports onset of memory loss, getting lost, confusional episodes  over a period of 3.5 years. The patient is not sure. Her husband tracks her movement on his phone, and she has been driving very little and only to [employer]. .    The couple lives in a private home together, no pets. Adult children live far away , 2 daughters,  but a daughter is planning soon to move next door.   She worked as a Nurse, learning disability at [employer].  She retired in her sixties.  She was raised in [place] and went through junior college in [place] and college at [employer]. Her husband went through law school after their marriage.  She is still driving(!). Husband does the banking and bill payment.  They use a maid service every 14 days. She still goes shopping with a list, but the couple is not cooking at home- they go out a lot.   She is not longer reading books, and she has misplaced items frequently.she repeats herself.    The daily routine: she rises at 7 AM, [employer] reader. Has coffee and nibbles, she is not going out, lunch is at home, dinner is out - and she doesn't walk, does participate in church and [employer] activity.  Bedtime is around 10 PM, no trouble to go to sleep and stay asleep.  Former smoker ,  wine with dinner.   No family member reached her age, father died at 60, mother at 21, a brother  deceased at age 39.  No history of Alzheimer's.   MRI reviewed and discussed.   Dr. Blanchard Mane wrote a detailed  introductory note for which are very grateful.  The patient has a good long-term memory and intermediate memory but she has a significant impairment in her short term memory.  This would also explain amnestic spells, she has gotten disoriented, she is not able to change pain or be spontaneously able to rearrange preconceived plan such as meeting in a restaurant and when 1 restaurant was closed changing the meeting place to another.  Sometimes she has misplaced item but sometimes she sees Ayden in the house that she does not know where they are from.  In general daily conversation she is able to hold her own in small talk.  She has a past medical history of nephritis, psoriasis, osteopenia uterine fibroids, peripheral vascular disease related to smoking, hyperlipidemia, hypertension, hypothyroidism and she had a fracture of the fifth metatarsal in 2018 and subclavian artery stenosis, carotid stenosis bilateral, urge incontinence, aortic atherosclerosis, chronic small vessel ischemia, a lacunar infarct by 2021 MRI and a left basal ganglia which is too small to manifest.  Some generalized cerebral atrophy.   Review of Systems: Out of a complete 14 system review, the patient complains of only the following symptoms, and all other reviewed systems are negative.  Memory impairment.   Social History   Socioeconomic History   Marital status: Married    Spouse name: Not on file   Number  of children: 2   Years of education: Not on file   Highest education level: Not on file  Occupational History   Occupation: Retired  Tobacco Use   Smoking status: Every Day    Current packs/day: 0.50    Average packs/day: 0.5 packs/day for 50.0 years (25.0 ttl pk-yrs)    Types: Cigarettes   Smokeless tobacco: Never   Tobacco comments:    no more than 5 cigarrettes a day  Vaping Use   Vaping status: Some Days  Substance and Sexual Activity   Alcohol use: Yes    Alcohol/week: 2.0 standard drinks of alcohol     Types: 2 Glasses of wine per week   Drug use: No   Sexual activity: Not on file  Other Topics Concern   Not on file  Social History Narrative   Not on file   Social Determinants of Health   Financial Resource Strain: Not on file  Food Insecurity: Not on file  Transportation Needs: Not on file  Physical Activity: Not on file  Stress: Not on file  Social Connections: Not on file  Intimate Partner Violence: Not on file    Family History  Problem Relation Age of Onset   Hypertension Mother    Heart disease Mother    Diabetes Father    Hypertension Father    Heart disease Father    Diabetes Paternal Grandmother    Hypertension Paternal Grandmother    Heart disease Paternal Grandmother    Hypertension Paternal Grandfather    Diabetes Paternal Grandfather    Heart disease Paternal Grandfather    Colon cancer Daughter     Past Medical History:  Diagnosis Date   Acute nephritis    around 56   Chronic kidney disease 1948   nehpritis    Colon polyp    COPD (chronic obstructive pulmonary disease) (HCC)    Borderline   Elevated cholesterol    FH: colon cancer    High blood pressure    Osteoporosis    PVD (peripheral vascular disease) (HCC)    Vocal cord polyp     Past Surgical History:  Procedure Laterality Date   ABDOMINAL HYSTERECTOMY     APPENDECTOMY  1952   BUNIONECTOMY  2005   COLON SURGERY  2001   tumor removed   COLONOSCOPY  05/27/2017   Mild sigmoid diverticulosis. OTherwise normal colonoscopy. Colon highly redundant   vocal chord nodules removed  2010    Current Outpatient Medications  Medication Sig Dispense Refill   amLODipine (NORVASC) 5 MG tablet daily.     aspirin 81 MG tablet Take 81 mg by mouth daily.     atorvastatin (LIPITOR) 10 MG tablet Take 10 mg by mouth once a week.     ezetimibe (ZETIA) 10 MG tablet daily.     levothyroxine (SYNTHROID, LEVOTHROID) 25 MCG tablet daily.     memantine (NAMENDA XR) 28 MG CP24 24 hr capsule Take 28 mg by  mouth daily.     mometasone (ELOCON) 0.1 % cream Apply topically daily.     MYRBETRIQ 50 MG TB24 tablet Take 50 mg by mouth daily.     solifenacin (VESICARE) 5 MG tablet daily.     bifidobacterium infantis (ALIGN) capsule Take 1 capsule by mouth daily. (Patient not taking: Reported on 07/14/2023) 30 capsule 3   clobetasol cream (TEMOVATE) 0.05 % Apply 1 application topically as needed.  (Patient not taking: Reported on 07/14/2023)     No current facility-administered medications for this  visit.    Allergies as of 07/14/2023 - Review Complete 07/14/2023  Allergen Reaction Noted   Latex  02/15/2012   Other Other (See Comments) 03/22/2017   Statins Other (See Comments) 03/22/2017    Vitals: BP (!) 133/49 (BP Location: Left Arm, Patient Position: Sitting, Cuff Size: Small)   Pulse 75   Ht 5\' 2"  (1.575 m)   Wt 81 lb 3.2 oz (36.8 kg)   BMI 14.85 kg/m  Last Weight:  Wt Readings from Last 1 Encounters:  07/14/23 81 lb 3.2 oz (36.8 kg)   Last Height:   Ht Readings from Last 1 Encounters:  07/14/23 5\' 2"  (1.575 m)   Last BMI: @ 14.85 Physical exam:  General: The patient is awake, alert and appears not in acute distress.  The patient is well groomed. Head: Normocephalic, atraumatic.  Neck is supple.   Cardiovascular:  Regular rate and palpable peripheral pulse:  Respiratory: clear to auscultation. She is coughing frequently   Skin:  With evidence of ankle  edema,  and bluish skin Trunk: BMI is15  and patient  has normal posture.   Neurologic exam : The patient is awake and alert, but not fully oriented to place and time.  Memory subjective  described as impaired .    07/14/2023    2:07 PM  MMSE - Mini Mental State Exam  Orientation to time 2  Orientation to Place 4  Registration 3  Attention/ Calculation 0  Recall 0  Language- name 2 objects 2  Language- repeat 1  Language- follow 3 step command 3  Language- read & follow direction 0  Write a sentence 1  Copy design 1   Total score 17     There is a normal attention span & concentration ability.  Speech is fluent with dysarthria, dysphonia but not aphasia. Mood and affect are appropriate.  Cranial nerves: wears glasses and hearing aids.  Pupils are equal and briskly reactive to light. Funduscopic exam without  evidence of pallor or edema. Extraocular movements  in vertical and horizontal planes intact and without nystagmus. Visual fields by finger perimetry are intact. Hearing to finger rub intact.   Facial sensation intact to fine touch. Facial motor strength is symmetric and tongue and uvula move midline.  Motor exam:   Normal tone and normal muscle bulk and symmetric normal strength in all extremities. Grip Strength  Proximal strength of shoulder muscles and hip flexors was intact.  Sensory:  Fine touch and vibration were tested . Proprioception was tested in the upper extremities only and was  normal.  Coordination: Alternating movements in the fingers/hands were  very slow.   Finger-to-nose maneuver was tested and showed  evidence of ataxia, dysmetria , not so much  tremor. There was pronator drift.   Gait and station: Patient walked in without assistive device - she needs a cane -  she braces against the wall, furniture, and leans on her husband.  Core Strength within normal limits. Stance is stable  Tandem gait is impossible, turns are fragmented.    Deep tendon reflexes: in the  upper and lower extremities are symmetric .Babinski maneuver response is  downgoing.   Assessment: Total time for face to face interview and examination, for review of  images and laboratory testing, neurophysiology testing and pre-existing records, including out-of -network , was 45 minutes. 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  2) starting medication-  Plan:  Treatment plan and additional workup planned after today includes:   1)  ATN today  2)  overall brain health advice, including social interaction, reduction of danger spots ( driving,  getting lost )  3) keep physically active  but I insist you no longer drive.  4) add Aricept 5 mg daily to Namenda.  5) MMSE of 17 is too low to qualify for any new infusion therapies, we will concentrate on accuracy of dementia type. We can give suggestions for symptom care / PCP will need to provide local caregiver support information.   Consider Norvasc to be replaced due to ankle edema   Melvyn Novas, MD   PS : addendum , Patients pharmacy called to inform us that this patient is already on Aricept 10 mg, the medication was not listed and not entered in EPIC .  I discontinued the prescription for 5 mg Aricept generic.    Melvyn Novas, MD

## 2023-07-14 NOTE — Patient Instructions (Signed)

## 2023-07-14 NOTE — Telephone Encounter (Signed)
Returned call to [facility] and spoke with [facility] regarding pt's prescription for donepezil 5 mg. Per Aundra Millet pt already had a Rx for donepezil 10 mg that is prescribed by her PCP Dr Sudie Bailey. I informed Megan Per Dr Vickey Huger that the pt can stay on the donepezil 10 mg and to cancel the new donepezil 5 mg that she had sent today. The pt did not informed us at the time of her visit that she was already taking this medication. Aundra Millet confirmed the cancellation of the new Rx and thanked me for calling.

## 2023-07-22 ENCOUNTER — Telehealth: Payer: Self-pay

## 2023-07-22 LAB — APOE ALZHEIMER'S RISK

## 2023-07-22 LAB — ATN PROFILE
A -- Beta-amyloid 42/40 Ratio: 0.113 (ref >0.102)
Beta-amyloid 40: 112.62 pg/mL
Beta-amyloid 42: 12.67 pg/mL
N -- NfL, Plasma: 5.82 pg/mL (ref 0.00–11.55)
T -- p-tau181: 1.29 pg/mL — ABNORMAL HIGH (ref 0.00–0.97)

## 2023-07-22 NOTE — Telephone Encounter (Signed)
-----   Message from Salisbury Dohmeier sent at 07/20/2023  5:05 PM EDT ----- The Alzheimer's disease most specific Biomarker was not present -only Phosphorylated  -Tau which is an unspecific marker of cell break down in many CNS disease processes.   Another memory impairment can be present and should be evaluated for ( aside from Alzheimer's) .

## 2023-07-22 NOTE — Telephone Encounter (Signed)
I spoke with the patient and provided the results of the blood work. She verbalized understanding of the findings and expressed appreciation for the call. All questions answered.

## 2023-08-02 DIAGNOSIS — R269 Unspecified abnormalities of gait and mobility: Secondary | ICD-10-CM | POA: Diagnosis not present

## 2023-08-02 DIAGNOSIS — I739 Peripheral vascular disease, unspecified: Secondary | ICD-10-CM | POA: Diagnosis not present

## 2023-08-02 DIAGNOSIS — R42 Dizziness and giddiness: Secondary | ICD-10-CM | POA: Diagnosis not present

## 2023-08-02 DIAGNOSIS — Z681 Body mass index (BMI) 19 or less, adult: Secondary | ICD-10-CM | POA: Diagnosis not present

## 2023-08-11 DIAGNOSIS — R2681 Unsteadiness on feet: Secondary | ICD-10-CM | POA: Diagnosis not present

## 2023-08-11 DIAGNOSIS — R42 Dizziness and giddiness: Secondary | ICD-10-CM | POA: Diagnosis not present

## 2023-08-11 DIAGNOSIS — G9389 Other specified disorders of brain: Secondary | ICD-10-CM | POA: Diagnosis not present

## 2023-08-11 DIAGNOSIS — I6523 Occlusion and stenosis of bilateral carotid arteries: Secondary | ICD-10-CM | POA: Diagnosis not present

## 2023-08-11 DIAGNOSIS — I739 Peripheral vascular disease, unspecified: Secondary | ICD-10-CM | POA: Diagnosis not present

## 2023-08-11 DIAGNOSIS — R269 Unspecified abnormalities of gait and mobility: Secondary | ICD-10-CM | POA: Diagnosis not present

## 2023-08-30 ENCOUNTER — Telehealth: Payer: Self-pay

## 2023-08-30 NOTE — Telephone Encounter (Signed)
-----   Message from Montezuma Dohmeier sent at 08/27/2023  3:34 PM EDT ----- Motion degraded MRI  per report, no acute findings.

## 2023-08-30 NOTE — Telephone Encounter (Signed)
Called patient in inforemed her per Dr. Vickey Huger "Motion degraded MRI  per report, no acute findings." Pt verbalized understanding. Pt had no questions at this time but was encouraged to call back if questions arise.

## 2023-09-14 DIAGNOSIS — E785 Hyperlipidemia, unspecified: Secondary | ICD-10-CM | POA: Diagnosis not present

## 2023-09-14 DIAGNOSIS — Z681 Body mass index (BMI) 19 or less, adult: Secondary | ICD-10-CM | POA: Diagnosis not present

## 2023-09-14 DIAGNOSIS — I10 Essential (primary) hypertension: Secondary | ICD-10-CM | POA: Diagnosis not present

## 2023-09-14 DIAGNOSIS — I739 Peripheral vascular disease, unspecified: Secondary | ICD-10-CM | POA: Diagnosis not present

## 2023-09-14 DIAGNOSIS — E039 Hypothyroidism, unspecified: Secondary | ICD-10-CM | POA: Diagnosis not present

## 2023-12-15 DIAGNOSIS — R911 Solitary pulmonary nodule: Secondary | ICD-10-CM | POA: Diagnosis not present

## 2023-12-18 DIAGNOSIS — R911 Solitary pulmonary nodule: Secondary | ICD-10-CM | POA: Diagnosis not present

## 2023-12-18 DIAGNOSIS — J439 Emphysema, unspecified: Secondary | ICD-10-CM | POA: Diagnosis not present

## 2024-01-26 ENCOUNTER — Encounter: Payer: Self-pay | Admitting: Adult Health

## 2024-01-26 ENCOUNTER — Ambulatory Visit: Payer: Medicare PPO | Admitting: Adult Health

## 2024-01-26 VITALS — BP 109/62 | HR 61 | Ht 62.0 in | Wt 79.0 lb

## 2024-01-26 DIAGNOSIS — F01B0 Vascular dementia, moderate, without behavioral disturbance, psychotic disturbance, mood disturbance, and anxiety: Secondary | ICD-10-CM

## 2024-01-26 DIAGNOSIS — G309 Alzheimer's disease, unspecified: Secondary | ICD-10-CM

## 2024-01-26 NOTE — Progress Notes (Signed)
 [facility] [address] [place]. [place] [address] [phone]       OFFICE FOLLOW UP NOTE  Ms. Kennon Rounds Date of Birth:  December 02, 1934 Medical Record Number:  [id number]    Primary neurologist: Dr. Vickey Huger Reason for visit: Memory loss    SUBJECTIVE:  CHIEF COMPLAINT:  Chief Complaint  Patient presents with   Memory Loss    Rm 3 with spouse Pt is well, spouse reports pt memory has declined since last visit.     Follow-up visit:  Prior visit: 07/14/2023 Dr. Vickey Huger (initial consult visit)  Brief HPI:   Charene Mccallister is a 89 y.o. female who was evaluated by Dr. Vickey Huger in 06/2023 for concerns of memory loss, getting lost while driving and confusional episodes over the past 3.5 years.  PMH significant for PVD, history of tobacco use, HLD, HTN hypothyroidism, subclavian artery stenosis, bilateral carotid stenosis, aortic arthrosclerosis, and silent left lentiform infarct by 2021 MRI brain.  MMSE 17/30.  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  Recommended checking ATN profile and adding Aricept 5 mg daily to Namenda. Dr. Marylou Flesher noted she was contacted by pharmacy after appointment noting patient is already on Aricept (although medication was not listed and not entered in epic).      Interval history:  Returns today for follow-up visit accompanied by her husband.  Husband reports memory has gradually declined since prior visit.  Primarily has difficulty with short-term memory such as forgetting recent conversations, forgets where certain things are place in the house.  No behavioral concerns.  Does need some assistance with ADLs.  Does not do any routine memory exercises or physical exercise.  Reports she sleeps well.  Appetite slightly diminished, eats small meals, picks at food. Orders take out 6 days of the week for dinner, tries to ensure healthier meals although she does prefer to eat cookies and other sweets.  Husband assist with  all IADLs.  MMSE today 19/30 (prior 17/30).  Remains on Aricept 10 mg nightly and Namenda 28mg  daily.  Denies any depression or anxiety.       ROS:   14 system review of systems performed and negative with exception of those listed in HPI  PMH:  Past Medical History:  Diagnosis Date   Acute nephritis    around 1948   Chronic kidney disease 1948   nehpritis    Colon polyp    COPD (chronic obstructive pulmonary disease) (HCC)    Borderline   Elevated cholesterol    FH: colon cancer    High blood pressure    Osteoporosis    PVD (peripheral vascular disease) (HCC)    Vocal cord polyp     PSH:  Past Surgical History:  Procedure Laterality Date   ABDOMINAL HYSTERECTOMY     APPENDECTOMY  1952   BUNIONECTOMY  2005   COLON SURGERY  2001   tumor removed   COLONOSCOPY  05/27/2017   Mild sigmoid diverticulosis. OTherwise normal colonoscopy. Colon highly redundant   vocal chord nodules removed  2010    Social History:  Social History   Socioeconomic History   Marital status: Married    Spouse name: Not on file   Number of children: 2   Years of education: Not on file   Highest education level: Not on file  Occupational History   Occupation: Retired  Tobacco Use   Smoking status: Every Day    Current packs/day: 0.50    Average packs/day: 0.5 packs/day for 50.0  years (25.0 ttl pk-yrs)    Types: Cigarettes   Smokeless tobacco: Never   Tobacco comments:    no more than 5 cigarrettes a day  Vaping Use   Vaping status: Some Days  Substance and Sexual Activity   Alcohol use: Yes    Alcohol/week: 2.0 standard drinks of alcohol    Types: 2 Glasses of wine per week   Drug use: No   Sexual activity: Not on file  Other Topics Concern   Not on file  Social History Narrative   Not on file   Social Drivers of Health   Financial Resource Strain: Not on file  Food Insecurity: Not on file  Transportation Needs: Not on file  Physical Activity: Not on file  Stress: Not  on file  Social Connections: Not on file  Intimate Partner Violence: Not on file    Family History:  Family History  Problem Relation Age of Onset   Hypertension Mother    Heart disease Mother    Diabetes Father    Hypertension Father    Heart disease Father    Diabetes Paternal Grandmother    Hypertension Paternal Grandmother    Heart disease Paternal Grandmother    Hypertension Paternal Grandfather    Diabetes Paternal Grandfather    Heart disease Paternal Grandfather    Colon cancer Daughter     Medications:   Current Outpatient Medications on File Prior to Visit  Medication Sig Dispense Refill   amLODipine (NORVASC) 5 MG tablet daily.     aspirin 81 MG tablet Take 81 mg by mouth daily.     atorvastatin (LIPITOR) 10 MG tablet Take 10 mg by mouth once a week.     donepezil (ARICEPT) 5 MG tablet Take 1 tablet (5 mg total) by mouth daily. 30 tablet 5   ezetimibe (ZETIA) 10 MG tablet daily.     levothyroxine (SYNTHROID, LEVOTHROID) 25 MCG tablet daily.     memantine (NAMENDA XR) 28 MG CP24 24 hr capsule Take 28 mg by mouth daily.     MYRBETRIQ 50 MG TB24 tablet Take 50 mg by mouth daily.     VITAMIN D, CHOLECALCIFEROL, PO Take by mouth.     No current facility-administered medications on file prior to visit.    Allergies:   Allergies  Allergen Reactions   Latex    Other Other (See Comments)    Other   Statins Other (See Comments)    Other      OBJECTIVE:  Physical Exam  Vitals:   01/26/24 1500  BP: 109/62  Pulse: 61  Weight: 79 lb (35.8 kg)  Height: 5\' 2"  (1.575 m)   Body mass index is 14.45 kg/m. No results found.  General: Frail very pleasant elderly Caucasian female, seated, in no evident distress  Neurologic Exam Mental Status: Awake and fully alert.  Fluent speech and language.  Disoriented to place and time. Recent memory impaired and remote memory intact. Attention span, concentration and fund of knowledge slightly impaired with husband  provided majority of history. Mood and affect appropriate.  Cranial Nerves: Pupils equal, briskly reactive to light. Extraocular movements full without nystagmus. Visual fields full to confrontation. Hearing intact. Facial sensation intact. Face, tongue, palate moves normally and symmetrically.  Motor: Normal bulk and tone. Normal strength in all tested extremity muscles Sensory.: intact to touch , pinprick , position and vibratory sensation.  Coordination: Rapid alternating movements normal in all extremities. Finger-to-nose and heel-to-shin performed accurately bilaterally. Gait and Station: [employer]  from chair without difficulty. Stance is normal. Gait demonstrates slow cautious gait without use of AD. Reflexes: 1+ and symmetric. Toes downgoing.      01/26/2024    3:09 PM 07/14/2023    2:07 PM  MMSE - Mini Mental State Exam  Orientation to time 2 2  Orientation to Place 4 4  Registration 3 3  Attention/ Calculation 2 0  Recall 0 0  Language- name 2 objects 2 2  Language- repeat 1 1  Language- follow 3 step command 3 3  Language- read & follow direction 1 0  Write a sentence 1 1  Copy design 0 1  Total score 19 17         ASSESSMENT/PLAN: Tayanna Talford is a 89 y.o. year old female with complaints of memory loss since around 2020, suspect more vascular related      Dementia:  Subjectively, gradual decline.  No behavioral concerns. MMSE 19/30 (prior 17/30) Continue Aricept and memantine at current dosages per PCP Discussed importance of routine memory exercises as well as routine physical activity, good sleep and healthy diet as well as importance of managing vascular risk factors ATN profile not consistent with Alzheimer's disease MRI brain 2021 moderate generalized atrophy, chronic left lentiform nucleus infarct, chronic small vessel disease     Follow up in 6 months or call earlier if needed   CC:  PCP: Philemon Kingdom, MD    I spent 30 minutes of face-to-face  and non-face-to-face time with patient and husband.  This included previsit chart review, lab review, study review, order entry, electronic health record documentation, patient education and discussion regarding above diagnoses and treatment plan and answered all other questions to patient's satisfaction  Ihor Austin, [facility]  [facility] [address] [place], [place] [address]  Phone [phone] Fax [phone] Note: This document was prepared with digital dictation and possible smart phrase technology. Any transcriptional errors that result from this process are unintentional.

## 2024-01-26 NOTE — Patient Instructions (Signed)
 Your Plan:  Continue Aricept and Namenda at current dosages  Increase cognitive and physical exercises as well as ensuring good sleep and healthy diet    Follow up in 6 months or call earlier if needed     Thank you for coming to see [place] at [facility]. I hope we have been able to provide you high quality care today.  You may receive a patient satisfaction survey over the next few weeks. We would appreciate your feedback and comments so that we may continue to improve ourselves and the health of our patients.    Vascular Dementia Vascular dementia, or VaD, is a condition that affects the way the brain works. It causes problems with thinking, memory, and behavior. It happens when something keeps the brain from getting enough blood. This causes damage to the brain. What are the causes? VaD is caused by reduced blood flow to the brain. Here are some common causes: Many small strokes. These may be silent strokes, which means they happen without any symptoms. A major stroke. Damage to blood vessels in the brain. Bleeding in the brain. What increases the risk? Having had a stroke. Having high cholesterol or high blood pressure, also called hypertension. Having a disease that affects the heart or blood vessels. Smoking. Being over age 2. Having any of these conditions: Diabetes. Metabolic syndrome. Obesity. Depression. A genetic condition that leads to stroke, such as a condition called CADASIL. What are the signs or symptoms? Symptoms of VaD can vary. The symptoms may be mild or severe. And they may happen suddenly or slowly over time. Mental symptoms: Getting confused and forgetting things. Problems with planning and judgment. Problems handling money. Getting lost in places you know well. Trouble with: Paying attention and staying focused. Following instructions. Understanding speech. Recognizing people you know. Daily activities and  self-care. Behavior symptoms: Feeling restless or angry. Personality changes. Depression. Feeling paranoid or having false beliefs known as delusions. Hallucinations. This means you see, hear, taste, smell, or feel things that aren't real. Physical symptoms: Weakness. Poor balance when walking. Incontinence. This means you're not able to control when you pee (urinate) or poop. Speaking problems. Symptoms may stay the same or get worse over time. Symptoms of VaD may be like those of Alzheimer's disease. The two conditions can also happen together. How is this diagnosed? VaD may be diagnosed based on: Your medical history and a physical exam. Symptoms. These may be changes that are reported by friends and family. Tests. These may include: Blood tests. Brain imaging tests. A neurological exam. This checks how you move, talk, and do daily tasks. Tests to check your thinking and memory. There's not a single test to find out if you have VaD. Different health care providers may help to make the diagnosis. These may include: A neurologist. This provider is an expert on the brain and nervous system. A neuropsychologist. This provider focuses on how problems in the brain affect behavior and thinking. How is this treated? There's no cure for VaD. The goal of treatment is to: Deal with what caused the VaD and control things that make it worse. This might mean: Controlling blood pressure or lowering your cholesterol. Treating diabetes. Making lifestyle changes, like quitting smoking or losing weight. Manage symptoms that affect behavior. Treatment may also include going to a team of therapists to help manage: Speech and language. Home safety and activity. In time, you may need care at home or in a care facility. Ask about resources  in your area. Follow these instructions at home: Medicines  Take over-the-counter and prescription medicines only as told by your provider. Use a pill organizer  or pill reminder to help you keep track of your medicines. Ask a caregiver to help you manage your medicines. Lifestyle Eat a healthy, balanced diet. Stay at a healthy weight. Lose weight if needed. Be active as told by your provider. Do not drink alcohol. Do not use any products that contain nicotine or tobacco. These products include cigarettes, chewing tobacco, and vaping devices, such as e-cigarettes. If you need help quitting, ask your provider. Safety Talk with your provider to decide on: What things you need help with. How to stay safe. If you have trouble walking, use a cane or walker as told. Make sure your home is safe. Get rid of things that you could trip over, such as throw rugs or clutter. Put grab bars and railings in your home to keep you from falling. Talk with your provider about whether it's safe for you to drive. If told, wear an alert bracelet that tracks where you are and shows that you're a person with memory loss. Make sure you wear it at all times. General instructions Work with your provider to decide on: What things you need help with. How to stay safe. Follow your provider's instructions for treating the cause of the dementia. Work with your family to make big legal or health decisions. This may include things like advance directives, medical power of attorney, or a living will. Join a support group for people with VaD. Where to find more information [employer] on Aging: [email or web address] Contact a health care provider if: You have new behavior problems. You start having trouble swallowing. Your confusion gets worse. You're more sleepy. Get help right away if: You faint. You lose your speech, balance, or thinking ability. You have new numbness or paralysis. You get a sudden, severe headache. You lose vision or it suddenly gets worse in one or both eyes. These symptoms may be an emergency. Get help right away. Call 911. Do not wait to see if the  symptoms will go away. Do not drive yourself to the hospital. Also get help right away if: You feel like you may hurt yourself or others. You have thoughts about taking your own life. Take one of these steps: Go to your nearest emergency room. Call 911. Call the National Suicide Prevention Lifeline at (541) 079-0778 or 988. This is open 24 hours a day. Text the Crisis Text Line at 281-716-0922. This information is not intended to replace advice given to you by your health care provider. Make sure you discuss any questions you have with your health care provider. Document Revised: 01/18/2023 Document Reviewed: 01/18/2023 Elsevier Patient Education  2024 [employer].

## 2024-02-15 DIAGNOSIS — K649 Unspecified hemorrhoids: Secondary | ICD-10-CM | POA: Diagnosis not present

## 2024-02-15 DIAGNOSIS — R9389 Abnormal findings on diagnostic imaging of other specified body structures: Secondary | ICD-10-CM | POA: Diagnosis not present

## 2024-02-15 DIAGNOSIS — R634 Abnormal weight loss: Secondary | ICD-10-CM | POA: Diagnosis not present

## 2024-02-15 DIAGNOSIS — K625 Hemorrhage of anus and rectum: Secondary | ICD-10-CM | POA: Diagnosis not present

## 2024-03-08 DIAGNOSIS — S0181XA Laceration without foreign body of other part of head, initial encounter: Secondary | ICD-10-CM | POA: Diagnosis not present

## 2024-03-08 DIAGNOSIS — R32 Unspecified urinary incontinence: Secondary | ICD-10-CM | POA: Diagnosis not present

## 2024-03-08 DIAGNOSIS — Z0000 Encounter for general adult medical examination without abnormal findings: Secondary | ICD-10-CM | POA: Diagnosis not present

## 2024-03-08 DIAGNOSIS — K644 Residual hemorrhoidal skin tags: Secondary | ICD-10-CM | POA: Diagnosis not present

## 2024-03-08 DIAGNOSIS — I10 Essential (primary) hypertension: Secondary | ICD-10-CM | POA: Diagnosis not present

## 2024-03-08 DIAGNOSIS — S0990XA Unspecified injury of head, initial encounter: Secondary | ICD-10-CM | POA: Diagnosis not present

## 2024-03-08 DIAGNOSIS — R159 Full incontinence of feces: Secondary | ICD-10-CM | POA: Diagnosis not present

## 2024-03-08 DIAGNOSIS — E785 Hyperlipidemia, unspecified: Secondary | ICD-10-CM | POA: Diagnosis not present

## 2024-03-08 DIAGNOSIS — Z1331 Encounter for screening for depression: Secondary | ICD-10-CM | POA: Diagnosis not present

## 2024-03-08 DIAGNOSIS — S0003XA Contusion of scalp, initial encounter: Secondary | ICD-10-CM | POA: Diagnosis not present

## 2024-03-16 DIAGNOSIS — S0101XD Laceration without foreign body of scalp, subsequent encounter: Secondary | ICD-10-CM | POA: Diagnosis not present

## 2024-03-16 DIAGNOSIS — I10 Essential (primary) hypertension: Secondary | ICD-10-CM | POA: Diagnosis not present

## 2024-03-29 DIAGNOSIS — R918 Other nonspecific abnormal finding of lung field: Secondary | ICD-10-CM | POA: Diagnosis not present

## 2024-03-29 DIAGNOSIS — I2510 Atherosclerotic heart disease of native coronary artery without angina pectoris: Secondary | ICD-10-CM | POA: Diagnosis not present

## 2024-03-29 DIAGNOSIS — J432 Centrilobular emphysema: Secondary | ICD-10-CM | POA: Diagnosis not present

## 2024-03-29 DIAGNOSIS — I700 Atherosclerosis of aorta: Secondary | ICD-10-CM | POA: Diagnosis not present

## 2024-03-29 DIAGNOSIS — J439 Emphysema, unspecified: Secondary | ICD-10-CM | POA: Diagnosis not present

## 2024-04-25 DIAGNOSIS — K644 Residual hemorrhoidal skin tags: Secondary | ICD-10-CM | POA: Diagnosis not present

## 2024-04-25 DIAGNOSIS — K648 Other hemorrhoids: Secondary | ICD-10-CM | POA: Diagnosis not present

## 2024-05-02 DIAGNOSIS — M810 Age-related osteoporosis without current pathological fracture: Secondary | ICD-10-CM | POA: Diagnosis not present

## 2024-05-02 DIAGNOSIS — E785 Hyperlipidemia, unspecified: Secondary | ICD-10-CM | POA: Diagnosis not present

## 2024-05-02 DIAGNOSIS — I10 Essential (primary) hypertension: Secondary | ICD-10-CM | POA: Diagnosis not present

## 2024-05-02 DIAGNOSIS — E039 Hypothyroidism, unspecified: Secondary | ICD-10-CM | POA: Diagnosis not present

## 2024-05-02 DIAGNOSIS — Z79899 Other long term (current) drug therapy: Secondary | ICD-10-CM | POA: Diagnosis not present

## 2024-05-02 DIAGNOSIS — E559 Vitamin D deficiency, unspecified: Secondary | ICD-10-CM | POA: Diagnosis not present

## 2024-05-04 DIAGNOSIS — K644 Residual hemorrhoidal skin tags: Secondary | ICD-10-CM | POA: Diagnosis not present

## 2024-05-04 DIAGNOSIS — F0390 Unspecified dementia without behavioral disturbance: Secondary | ICD-10-CM | POA: Diagnosis not present

## 2024-05-04 DIAGNOSIS — R32 Unspecified urinary incontinence: Secondary | ICD-10-CM | POA: Diagnosis not present

## 2024-05-04 DIAGNOSIS — E785 Hyperlipidemia, unspecified: Secondary | ICD-10-CM | POA: Diagnosis not present

## 2024-05-04 DIAGNOSIS — I10 Essential (primary) hypertension: Secondary | ICD-10-CM | POA: Diagnosis not present

## 2024-05-04 DIAGNOSIS — E039 Hypothyroidism, unspecified: Secondary | ICD-10-CM | POA: Diagnosis not present

## 2024-07-21 DIAGNOSIS — J189 Pneumonia, unspecified organism: Secondary | ICD-10-CM | POA: Diagnosis not present

## 2024-07-21 DIAGNOSIS — J440 Chronic obstructive pulmonary disease with acute lower respiratory infection: Secondary | ICD-10-CM | POA: Diagnosis not present

## 2024-07-21 DIAGNOSIS — E872 Acidosis, unspecified: Secondary | ICD-10-CM | POA: Diagnosis not present

## 2024-07-21 DIAGNOSIS — J9601 Acute respiratory failure with hypoxia: Secondary | ICD-10-CM | POA: Diagnosis not present

## 2024-07-21 DIAGNOSIS — I742 Embolism and thrombosis of arteries of the upper extremities: Secondary | ICD-10-CM | POA: Diagnosis not present

## 2024-07-21 DIAGNOSIS — R0602 Shortness of breath: Secondary | ICD-10-CM | POA: Diagnosis not present

## 2024-07-21 DIAGNOSIS — Z681 Body mass index (BMI) 19 or less, adult: Secondary | ICD-10-CM | POA: Diagnosis not present

## 2024-07-21 DIAGNOSIS — E876 Hypokalemia: Secondary | ICD-10-CM | POA: Diagnosis not present

## 2024-07-21 DIAGNOSIS — R0902 Hypoxemia: Secondary | ICD-10-CM | POA: Diagnosis not present

## 2024-07-21 DIAGNOSIS — A419 Sepsis, unspecified organism: Secondary | ICD-10-CM | POA: Diagnosis not present

## 2024-07-21 DIAGNOSIS — J441 Chronic obstructive pulmonary disease with (acute) exacerbation: Secondary | ICD-10-CM | POA: Diagnosis not present

## 2024-07-21 DIAGNOSIS — E43 Unspecified severe protein-calorie malnutrition: Secondary | ICD-10-CM | POA: Diagnosis not present

## 2024-07-24 ENCOUNTER — Telehealth: Payer: Self-pay

## 2024-07-24 NOTE — Transitions of Care (Post Inpatient/ED Visit) (Signed)
 Today's TOC FU Call Status: Today's TOC FU Call Status:: Successful TOC FU Call Completed TOC FU Call Complete Date: 07/24/24 (Per spouse as informant and without a DC Summary available.) Patient's Name and Date of Birth confirmed.  Transition Care Management Follow-up Telephone Call Date of Discharge: 07/23/24 Discharge Facility: Other [facility]) Name of Other ([facility]) Discharge Facility: [facility] Type of Discharge: Inpatient Admission Primary Inpatient Discharge Diagnosis:: Pnuemonia (Per spouse as informant and without a DC Summary available.) How have you been since you were released from the hospital?: Better Any questions or concerns?: No  Items Reviewed: Did you receive and understand the discharge instructions provided?: Yes Medications obtained,verified, and reconciled?: Yes (Medications Reviewed) (Per spouse as informant and without a DC Summary available.) Any new allergies since your discharge?: No Dietary orders reviewed?: NA Do you have support at home?: Yes People in Home [RPT]: spouse Name of Support/Comfort Primary Source: AVYONNA, WAGONER  [phone] [facility] Phone)  Medications Reviewed Today: Medications Reviewed Today     Reviewed by Carolee Heron NOVAK, RN (Case Manager) on 07/24/24 at 1554  Med List Status: <None>   Medication Order Taking? Sig Documenting Provider Last Dose Status Informant  amLODipine (NORVASC) 5 MG tablet [id number] Yes daily. [provider]  Active   aspirin 81 MG tablet [id number]  Take 81 mg by mouth daily. [provider]  Active   atorvastatin (LIPITOR) 10 MG tablet [id number]  Take 10 mg by mouth once a week.  Patient not taking: Reported on 07/24/2024   [provider]  Active   docusate sodium (COLACE) 100 MG capsule [id number] Yes Take 100 mg by mouth daily. [provider]  Active   donepezil  (ARICEPT ) 10 MG tablet [id number] Yes Take 10 mg by mouth at bedtime. [provider]   Active   doxycycline (VIBRAMYCIN) 100 MG capsule [id number] Yes Take 100 mg by mouth 2 (two) times daily. [provider]  Active   ezetimibe (ZETIA) 10 MG tablet [id number] Yes daily. [provider]  Active   levothyroxine (SYNTHROID, LEVOTHROID) 25 MCG tablet [id number] Yes daily. [provider]  Active   memantine (NAMENDA XR) 28 MG CP24 24 hr capsule [id number] Yes Take 28 mg by mouth daily. [provider]  Active   memantine (NAMENDA) 10 MG tablet [id number] Yes Take 10 mg by mouth daily. Spouse reported on medication review on discharge 07/23/24. Spouse reported checked with PCP and meant to combine with other dose, please review on next medication review/reconciliation. Thank you. [provider]  Active   MYRBETRIQ 50 MG TB24 tablet [id number] Yes Take 50 mg by mouth daily. [provider]  Active   VITAMIN D, CHOLECALCIFEROL, PO [id number]  Take by mouth. [provider]  Active   Med List Note Sydell Heron NOVAK, RN 07/24/24 1551): Spouse reported prednisone taper dose, but unclear at what doseage. Please review on PCP HFU.             Home Care and Equipment/Supplies: Were Home Health Services Ordered?: No Any new equipment or medical supplies ordered?: No  Functional Questionnaire: Do you need assistance with bathing/showering or dressing?: No Do you need assistance with meal preparation?: No Do you need assistance with eating?: No Do you have difficulty maintaining continence: No Do you need assistance with getting out of bed/getting out of a chair/moving?: No Do you have difficulty managing or taking your medications?:  (Spouse manages medications due memory issues.)  Follow up appointments reviewed: PCP Follow-up appointment  confirmed?: Yes Date of PCP follow-up appointment?: 07/27/24 Follow-up Provider: [facility] Jefferey at [facility] Follow-up appointment confirmed?: Yes Date of Specialist  follow-up appointment?: 08/03/24 Follow-Up Specialty Provider:: [facility];Urological Gynecology   [address]   [place], [place] [address]- Do you need transportation to your follow-up appointment?: No Do you understand care options if your condition(s) worsen?: Yes-patient verbalized understanding (per spouse as informant, RN CM had no DC Summary to go by.)  SDOH Interventions Today    Flowsheet Row Most Recent Value  SDOH Interventions   Food Insecurity Interventions Intervention Not Indicated  Housing Interventions Intervention Not Indicated  Transportation Interventions Intervention Not Indicated, Patient Resources (Friends/Family)  Utilities Interventions Intervention Not Indicated  Health Literacy Interventions Patient unable to respond, Intervention Not Indicated    07/24/24: TOC RN CM post discharge outreach was completed with spouse via phone call.  Patient was discharged from [facility] and no DC Summary was available via EPIC or Care Everywhere.  PCP office has not yet received one yet when checked with the provider.  Spouse stated that patient was doing better, and that he understood discharge instructions and medication review, and follow up appointments.  Please see note in red on Prednisone taper dose.  Spouse declined need for follow up call/30 Day program but was appreciative of discharge follow up call.   Bing Edison MSN, RN RN Case Sales executive Health  [facility] Office Hours M-F [phone] Direct Dial: [phone] Main Phone [phone]  Fax: [phone] [email or web address]

## 2024-07-26 DIAGNOSIS — J439 Emphysema, unspecified: Secondary | ICD-10-CM | POA: Diagnosis not present

## 2024-07-26 DIAGNOSIS — R0902 Hypoxemia: Secondary | ICD-10-CM | POA: Diagnosis not present

## 2024-07-26 DIAGNOSIS — R0602 Shortness of breath: Secondary | ICD-10-CM | POA: Diagnosis not present

## 2024-07-27 DIAGNOSIS — J189 Pneumonia, unspecified organism: Secondary | ICD-10-CM | POA: Diagnosis not present

## 2024-07-27 DIAGNOSIS — Z681 Body mass index (BMI) 19 or less, adult: Secondary | ICD-10-CM | POA: Diagnosis not present

## 2024-07-27 DIAGNOSIS — J432 Centrilobular emphysema: Secondary | ICD-10-CM | POA: Diagnosis not present

## 2024-08-01 NOTE — Progress Notes (Unsigned)
 [facility] [address] [place]. [place] [address] [phone]       OFFICE FOLLOW UP NOTE  Ms. Sharyne Goodell Date of Birth:  02-23-1935 Medical Record Number:  [id number]    Primary neurologist: Dr. Chalice Reason for visit: Memory loss    SUBJECTIVE:  CHIEF COMPLAINT:  No chief complaint on file.   Follow-up visit:  Prior visit: 01/26/2024  Brief HPI:   Ashton Sabine is a 89 y.o. female who was evaluated by Dr. Chalice in 06/2023 for concerns of memory loss, getting lost while driving and confusional episodes over the past 3.5 years.  PMH significant for PVD, history of tobacco use, HLD, HTN hypothyroidism, subclavian artery stenosis, bilateral carotid stenosis, aortic arthrosclerosis, and silent left lentiform infarct by 2021 MRI brain.  MMSE 17/30.  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  Recommended checking ATN profile and adding Aricept  5 mg daily to Namenda. Dr. Albin noted she was contacted by pharmacy after appointment noting patient is already on Aricept  (although medication was not listed and not entered in epic).   At prior visit, reported continued gradual cognitive decline. MMSE 19/30. Continued on Aricept  and Namenda.      Interval history:     Returns today for follow-up visit accompanied by her husband.  Husband reports memory has gradually declined since prior visit.  Primarily has difficulty with short-term memory such as forgetting recent conversations, forgets where certain things are place in the house.  No behavioral concerns.  Does need some assistance with ADLs.  Does not do any routine memory exercises or physical exercise.  Reports she sleeps well.  Appetite slightly diminished, eats small meals, picks at food. Orders take out 6 days of the week for dinner, tries to ensure healthier meals although she does prefer to eat cookies and other sweets.  Husband assist with all IADLs.  MMSE today 19/30 (prior  17/30).  Remains on Aricept  10 mg nightly and Namenda 28mg  daily.  Denies any depression or anxiety.       ROS:   14 system review of systems performed and negative with exception of those listed in HPI  PMH:  Past Medical History:  Diagnosis Date   Acute nephritis    around 1948   Chronic kidney disease 1948   nehpritis    Colon polyp    COPD (chronic obstructive pulmonary disease) (HCC)    Borderline   Elevated cholesterol    FH: colon cancer    High blood pressure    Osteoporosis    PVD (peripheral vascular disease) (HCC)    Vocal cord polyp     PSH:  Past Surgical History:  Procedure Laterality Date   ABDOMINAL HYSTERECTOMY     APPENDECTOMY  1952   BUNIONECTOMY  2005   COLON SURGERY  2001   tumor removed   COLONOSCOPY  05/27/2017   Mild sigmoid diverticulosis. OTherwise normal colonoscopy. Colon highly redundant   vocal chord nodules removed  2010    Social History:  Social History   Socioeconomic History   Marital status: Married    Spouse name: Not on file   Number of children: 2   Years of education: Not on file   Highest education level: Not on file  Occupational History   Occupation: Retired  Tobacco Use   Smoking status: Every Day    Current packs/day: 0.50    Average packs/day: 0.5 packs/day for 50.0 years (25.0 ttl pk-yrs)    Types: Cigarettes  Smokeless tobacco: Never   Tobacco comments:    no more than 5 cigarrettes a day  Vaping Use   Vaping status: Some Days  Substance and Sexual Activity   Alcohol  use: Yes    Alcohol /week: 2.0 standard drinks of alcohol     Types: 2 Glasses of wine per week   Drug use: No   Sexual activity: Not on file  Other Topics Concern   Not on file  Social History Narrative   Not on file   Social Drivers of Health   Financial Resource Strain: Not on file  Food Insecurity: No Food Insecurity (07/24/2024)   Hunger Vital Sign    Worried About Running Out of Food in the Last Year: Never true    Ran Out of  Food in the Last Year: Never true  Transportation Needs: No Transportation Needs (07/24/2024)   PRAPARE - Administrator, Civil Service (Medical): No    Lack of Transportation (Non-Medical): No  Physical Activity: Not on file  Stress: Not on file  Social Connections: Unknown (07/24/2024)   Social Connection and Isolation Panel    Frequency of Communication with Friends and Family: Patient unable to answer    Frequency of Social Gatherings with Friends and Family: Not on file    Attends Religious Services: Patient unable to answer    Active Member of Clubs or Organizations: Patient unable to answer    Attends Banker Meetings: Patient unable to answer    Marital Status: Patient unable to answer  Intimate Partner Violence: Not At Risk (07/24/2024)   Humiliation, Afraid, Rape, and Kick questionnaire    Fear of Current or Ex-Partner: No    Emotionally Abused: No    Physically Abused: No    Sexually Abused: No    Family History:  Family History  Problem Relation Age of Onset   Hypertension Mother    Heart disease Mother    Diabetes Father    Hypertension Father    Heart disease Father    Diabetes Paternal Grandmother    Hypertension Paternal Grandmother    Heart disease Paternal Grandmother    Hypertension Paternal Grandfather    Diabetes Paternal Grandfather    Heart disease Paternal Grandfather    Colon cancer Daughter     Medications:   Current Outpatient Medications on File Prior to Visit  Medication Sig Dispense Refill   amLODipine (NORVASC) 5 MG tablet daily.     aspirin 81 MG tablet Take 81 mg by mouth daily.     atorvastatin (LIPITOR) 10 MG tablet Take 10 mg by mouth once a week. (Patient not taking: Reported on 07/24/2024)     docusate sodium (COLACE) 100 MG capsule Take 100 mg by mouth daily.     donepezil  (ARICEPT ) 10 MG tablet Take 10 mg by mouth at bedtime.     doxycycline (VIBRAMYCIN) 100 MG capsule Take 100 mg by mouth 2 (two) times daily.      ezetimibe (ZETIA) 10 MG tablet daily.     levothyroxine (SYNTHROID, LEVOTHROID) 25 MCG tablet daily.     memantine (NAMENDA XR) 28 MG CP24 24 hr capsule Take 28 mg by mouth daily.     memantine (NAMENDA) 10 MG tablet Take 10 mg by mouth daily. Spouse reported on medication review on discharge 07/23/24. Spouse reported checked with PCP and meant to combine with other dose, please review on next medication review/reconciliation. Thank you.     MYRBETRIQ 50 MG TB24 tablet Take 50  mg by mouth daily.     VITAMIN D, CHOLECALCIFEROL, PO Take by mouth.     No current facility-administered medications on file prior to visit.    Allergies:   Allergies  Allergen Reactions   Latex    Other Other (See Comments)    Other   Statins Other (See Comments)    Other      OBJECTIVE:  Physical Exam  There were no vitals filed for this visit.  There is no height or weight on file to calculate BMI. No results found.  General: Frail very pleasant elderly Caucasian female, seated, in no evident distress  Neurologic Exam Mental Status: Awake and fully alert.  Fluent speech and language.  Disoriented to place and time. Recent memory impaired and remote memory intact. Attention span, concentration and fund of knowledge slightly impaired with husband provided majority of history. Mood and affect appropriate.  Cranial Nerves: Pupils equal, briskly reactive to light. Extraocular movements full without nystagmus. Visual fields full to confrontation. Hearing intact. Facial sensation intact. Face, tongue, palate moves normally and symmetrically.  Motor: Normal bulk and tone. Normal strength in all tested extremity muscles Sensory.: intact to touch , pinprick , position and vibratory sensation.  Coordination: Rapid alternating movements normal in all extremities. Finger-to-nose and heel-to-shin performed accurately bilaterally. Gait and Station: Arises from chair without difficulty. Stance is normal. Gait  demonstrates slow cautious gait without use of AD. Reflexes: 1+ and symmetric. Toes downgoing.      01/26/2024    3:09 PM 07/14/2023    2:07 PM  MMSE - Mini Mental State Exam  Orientation to time 2 2  Orientation to Place 4 4  Registration 3 3  Attention/ Calculation 2 0  Recall 0 0  Language- name 2 objects 2 2  Language- repeat 1 1  Language- follow 3 step command 3 3  Language- read & follow direction 1 0  Write a sentence 1 1  Copy design 0 1  Total score 19 17         ASSESSMENT/PLAN: Elain Wixon is a 89 y.o. year old female with complaints of memory loss since around 2020, suspect more vascular related      Dementia:  Subjectively, gradual decline.  No behavioral concerns. MMSE ***/30 (prior 19/30) Continue Aricept  and memantine at current dosages per PCP Discussed importance of routine memory exercises as well as routine physical activity, good sleep and healthy diet as well as importance of managing vascular risk factors ATN profile not consistent with Alzheimer's disease MRI brain 2021 moderate generalized atrophy, chronic left lentiform nucleus infarct, chronic small vessel disease     Follow up in 6 months or call earlier if needed   CC:  PCP: Jefferey Fitch, MD     I personally spent a total of *** minutes in the care of the patient today including {Time Based Coding:210964241}.    Harlene Bogaert, AGNP-BC  [facility] [address] [place], [place] [address]  Phone [phone] Fax [phone] Note: This document was prepared with digital dictation and possible smart phrase technology. Any transcriptional errors that result from this process are unintentional.

## 2024-08-02 ENCOUNTER — Ambulatory Visit: Admitting: Adult Health

## 2024-08-02 ENCOUNTER — Encounter: Payer: Self-pay | Admitting: Adult Health

## 2024-08-02 VITALS — BP 104/58 | HR 61 | Ht 64.0 in | Wt 74.0 lb

## 2024-08-02 DIAGNOSIS — F01B0 Vascular dementia, moderate, without behavioral disturbance, psychotic disturbance, mood disturbance, and anxiety: Secondary | ICD-10-CM | POA: Diagnosis not present

## 2024-08-02 DIAGNOSIS — G309 Alzheimer's disease, unspecified: Secondary | ICD-10-CM

## 2024-08-02 NOTE — Patient Instructions (Addendum)
 Your Plan:  Continue Namenda XR 28mg  nightly - please do not combine with 10mg  twice daily prescription  Continue Aricept  10mg  nightly   Ensure routine physical and cognitive activities as well as ensuring good sleep, healthy diet, routine socialization and management of vascular risk factors      You can return back to your PCP at this time. Please call with any questions or concerns in the future.      Thank you for coming to see us  at [facility]. I hope we have been able to provide you high quality care today.  You may receive a patient satisfaction survey over the next few weeks. We would appreciate your feedback and comments so that we may continue to improve ourselves and the health of our patients.

## 2024-08-03 DIAGNOSIS — N3281 Overactive bladder: Secondary | ICD-10-CM | POA: Diagnosis not present

## 2024-08-03 DIAGNOSIS — F03B0 Unspecified dementia, moderate, without behavioral disturbance, psychotic disturbance, mood disturbance, and anxiety: Secondary | ICD-10-CM | POA: Diagnosis not present

## 2024-08-03 DIAGNOSIS — Z7409 Other reduced mobility: Secondary | ICD-10-CM | POA: Diagnosis not present

## 2024-08-03 DIAGNOSIS — N3946 Mixed incontinence: Secondary | ICD-10-CM | POA: Diagnosis not present

## 2024-09-06 ENCOUNTER — Ambulatory Visit (HOSPITAL_BASED_OUTPATIENT_CLINIC_OR_DEPARTMENT_OTHER)
Admission: EM | Admit: 2024-09-06 | Discharge: 2024-09-06 | Disposition: A | Discharge status: Other Acute Inpatient Hospital

## 2024-09-06 DIAGNOSIS — R230 Cyanosis: Secondary | ICD-10-CM | POA: Diagnosis not present

## 2024-09-06 DIAGNOSIS — R0602 Shortness of breath: Secondary | ICD-10-CM | POA: Diagnosis not present

## 2024-09-06 DIAGNOSIS — J398 Other specified diseases of upper respiratory tract: Secondary | ICD-10-CM | POA: Diagnosis not present

## 2024-09-06 DIAGNOSIS — J181 Lobar pneumonia, unspecified organism: Secondary | ICD-10-CM | POA: Diagnosis not present

## 2024-09-06 DIAGNOSIS — J189 Pneumonia, unspecified organism: Secondary | ICD-10-CM | POA: Diagnosis not present

## 2024-09-06 DIAGNOSIS — D72829 Elevated white blood cell count, unspecified: Secondary | ICD-10-CM | POA: Diagnosis not present

## 2024-09-06 DIAGNOSIS — R0600 Dyspnea, unspecified: Secondary | ICD-10-CM | POA: Diagnosis not present

## 2024-09-06 DIAGNOSIS — R059 Cough, unspecified: Secondary | ICD-10-CM | POA: Diagnosis not present

## 2024-09-06 DIAGNOSIS — E43 Unspecified severe protein-calorie malnutrition: Secondary | ICD-10-CM | POA: Diagnosis not present

## 2024-09-06 DIAGNOSIS — R0603 Acute respiratory distress: Secondary | ICD-10-CM

## 2024-09-06 DIAGNOSIS — I10 Essential (primary) hypertension: Secondary | ICD-10-CM | POA: Diagnosis not present

## 2024-09-06 DIAGNOSIS — Z681 Body mass index (BMI) 19 or less, adult: Secondary | ICD-10-CM | POA: Diagnosis not present

## 2024-09-06 DIAGNOSIS — I2510 Atherosclerotic heart disease of native coronary artery without angina pectoris: Secondary | ICD-10-CM | POA: Diagnosis not present

## 2024-09-06 DIAGNOSIS — J9601 Acute respiratory failure with hypoxia: Secondary | ICD-10-CM

## 2024-09-06 NOTE — ED Notes (Signed)
 Patient is being discharged from the Urgent Care and sent to the Emergency Department via EMS . Per Rocky Gilford , patient is in need of higher level of care due to shortness of breath, low oxygen, abnormal vital signs. Patient is aware and verbalizes understanding of plan of care. There were no vitals filed for this visit.

## 2024-09-06 NOTE — ED Provider Notes (Signed)
 I was called to the front desk to evaluate patient who was in respiratory distress not able to speak in full sentences.  Nursing staff was with me and her initial oxygen saturation was 61% on room air.  She was initially placed on 6 L via nasal cannula and her oxygen saturation improved to the 80s so was then placed on a nonrebreather where her oxygen saturation improved to the mid to upper 90s.  Her heart rate remained in the 110s and she was confused and remained unable to follow commands or speak in full sentences while monitored by nursing staff for several additional minutes while awaiting EMS transport.  Blood sugar was 261.  EMS was called for transport due to hypoxia and respiratory distress and she was taken by [facility] to [facility].  By the time she was being transported her oxygen saturation had been consistently in the 90s and she was following commands but continued to be tachypneic and tachycardic.    Sherrell Rocky POUR, PA-C 09/06/24 2118

## 2024-09-07 DIAGNOSIS — E43 Unspecified severe protein-calorie malnutrition: Secondary | ICD-10-CM | POA: Diagnosis not present

## 2024-09-07 DIAGNOSIS — J9601 Acute respiratory failure with hypoxia: Secondary | ICD-10-CM | POA: Diagnosis not present

## 2024-09-07 DIAGNOSIS — J159 Unspecified bacterial pneumonia: Secondary | ICD-10-CM | POA: Diagnosis not present

## 2024-09-07 DIAGNOSIS — J181 Lobar pneumonia, unspecified organism: Secondary | ICD-10-CM | POA: Diagnosis not present

## 2024-09-07 DIAGNOSIS — D72829 Elevated white blood cell count, unspecified: Secondary | ICD-10-CM | POA: Diagnosis not present

## 2024-09-08 DIAGNOSIS — E43 Unspecified severe protein-calorie malnutrition: Secondary | ICD-10-CM | POA: Diagnosis not present

## 2024-09-08 DIAGNOSIS — J9601 Acute respiratory failure with hypoxia: Secondary | ICD-10-CM | POA: Diagnosis not present

## 2024-09-08 DIAGNOSIS — J159 Unspecified bacterial pneumonia: Secondary | ICD-10-CM | POA: Diagnosis not present

## 2024-09-11 ENCOUNTER — Telehealth: Payer: Self-pay

## 2024-09-11 NOTE — Transitions of Care (Post Inpatient/ED Visit) (Signed)
   09/11/2024  Name: Carmen Lang MRN: [id number] DOB: 1935/07/12  Today's TOC FU Call Status: Today's TOC FU Call Status:: Unsuccessful Call (1st Attempt) Unsuccessful Call (1st Attempt) Date: 09/11/24  Attempted to reach the patient regarding the most recent Inpatient/ED visit.  Follow Up Plan: Additional outreach attempts will be made to reach the patient to complete the Transitions of Care (Post Inpatient/ED visit) call.   Claude Waldman J. Jamielee Mchale RN, MSN [facility], [facility] RN Care Manager Direct Dial: [phone]  Fax: [phone] Website: [email or web address]

## 2024-09-12 ENCOUNTER — Telehealth: Payer: Self-pay

## 2024-09-12 DIAGNOSIS — J432 Centrilobular emphysema: Secondary | ICD-10-CM | POA: Diagnosis not present

## 2024-09-12 DIAGNOSIS — Z8701 Personal history of pneumonia (recurrent): Secondary | ICD-10-CM | POA: Diagnosis not present

## 2024-09-12 DIAGNOSIS — Z681 Body mass index (BMI) 19 or less, adult: Secondary | ICD-10-CM | POA: Diagnosis not present

## 2024-09-12 NOTE — Transitions of Care (Post Inpatient/ED Visit) (Signed)
   09/12/2024  Name: Madgeline Rayo MRN: [id number] DOB: 1935/07/26  Today's TOC FU Call Status: Today's TOC FU Call Status:: Unsuccessful Call (2nd Attempt) Unsuccessful Call (2nd Attempt) Date: 09/12/24  Attempted to reach the patient regarding the most recent Inpatient/ED visit.  Follow Up Plan: Additional outreach attempts will be made to reach the patient to complete the Transitions of Care (Post Inpatient/ED visit) call.   Dawaun Brancato J. Shaquala Broeker RN, MSN [facility], [facility] RN Care Manager Direct Dial: [phone]  Fax: [phone] Website: [email or web address]

## 2024-09-13 ENCOUNTER — Telehealth: Payer: Self-pay

## 2024-09-13 NOTE — Transitions of Care (Post Inpatient/ED Visit) (Signed)
   09/13/2024  Name: Carmen Lang MRN: [id number] DOB: [birth year]  Today's TOC FU Call Status: Today's TOC FU Call Status:: Unsuccessful Call (3rd Attempt) Unsuccessful Call (3rd Attempt) Date: 09/13/24  Attempted to reach the patient regarding the most recent Inpatient/ED visit.  Follow Up Plan: No further outreach attempts will be made at this time. We have been unable to contact the patient.  Avien Taha J. Azlan Hanway RN, MSN [facility], [facility] RN Care Manager Direct Dial: [phone]  Fax: [phone] Website: [email or web address]

## 2024-09-15 DIAGNOSIS — J441 Chronic obstructive pulmonary disease with (acute) exacerbation: Secondary | ICD-10-CM | POA: Diagnosis not present

## 2024-09-20 DIAGNOSIS — R1312 Dysphagia, oropharyngeal phase: Secondary | ICD-10-CM | POA: Diagnosis not present

## 2024-09-20 DIAGNOSIS — Z8701 Personal history of pneumonia (recurrent): Secondary | ICD-10-CM | POA: Diagnosis not present

## 2024-10-10 DIAGNOSIS — R918 Other nonspecific abnormal finding of lung field: Secondary | ICD-10-CM | POA: Diagnosis not present

## 2024-10-10 DIAGNOSIS — J449 Chronic obstructive pulmonary disease, unspecified: Secondary | ICD-10-CM | POA: Diagnosis not present

## 2024-10-16 DIAGNOSIS — Z792 Long term (current) use of antibiotics: Secondary | ICD-10-CM | POA: Diagnosis not present

## 2024-10-16 DIAGNOSIS — R1314 Dysphagia, pharyngoesophageal phase: Secondary | ICD-10-CM | POA: Diagnosis not present

## 2024-10-16 DIAGNOSIS — Z7952 Long term (current) use of systemic steroids: Secondary | ICD-10-CM | POA: Diagnosis not present

## 2024-10-20 DIAGNOSIS — K220 Achalasia of cardia: Secondary | ICD-10-CM | POA: Diagnosis not present

## 2024-10-21 DIAGNOSIS — J432 Centrilobular emphysema: Secondary | ICD-10-CM | POA: Diagnosis not present

## 2024-10-24 DIAGNOSIS — R1310 Dysphagia, unspecified: Secondary | ICD-10-CM | POA: Diagnosis not present

## 2024-10-24 DIAGNOSIS — R1314 Dysphagia, pharyngoesophageal phase: Secondary | ICD-10-CM | POA: Diagnosis not present

## 2024-10-24 DIAGNOSIS — R634 Abnormal weight loss: Secondary | ICD-10-CM | POA: Diagnosis not present

## 2024-10-24 DIAGNOSIS — K220 Achalasia of cardia: Secondary | ICD-10-CM | POA: Diagnosis not present

## 2024-11-20 ENCOUNTER — Other Ambulatory Visit: Payer: Self-pay | Admitting: Oncology

## 2024-11-20 DIAGNOSIS — C159 Malignant neoplasm of esophagus, unspecified: Secondary | ICD-10-CM

## 2024-11-20 NOTE — Progress Notes (Signed)
 " Carmen Lang at [facility] [address] [place],  [place]  [address] [phone]  Clinic Day:  11/21/2024  Referring physician: Jefferey Fitch, MD   HISTORY OF PRESENT ILLNESS:  The patient is a 89 y.o. female who I was asked to consult upon for newly diagnosed squamous cell esophageal cancer.  Her history dates back approximately 3 months ago when she began having progressive dysphagia.  It progressed to the point where she could not even tolerate a soft mechanical diet.  Furthermore, even swallowing liquids became a problem.  During this time, she has also lost 30 pounds.  A chest CT done in October 2025 showed a prominent mid esophagus, which raised the question for a potential neoplasm being in this region.  The patient eventually underwent an EGD in late December 2025, which showed a large, friable mid-esophageal mass.  A biopsy of this lesion was performed, which revealed a poorly differentiated squamous cell carcinoma.  The patient comes in today with her family to go over these biopsy results and their implications.  Other than her dysphagia, the major health issue which impacts her on a daily basis is her fairly severe dementia.    PAST MEDICAL HISTORY:   Past Medical History:  Diagnosis Date   Acute nephritis    around 1948   Cancer [facility])    ESOPHAGUS   Chronic kidney disease 1948   nehpritis    Colon polyp    COPD (chronic obstructive pulmonary disease) (HCC)    Borderline   Elevated cholesterol    FH: colon cancer    High blood pressure    Osteoporosis    PVD (peripheral vascular disease)    Vocal cord polyp     PAST SURGICAL HISTORY:   Past Surgical History:  Procedure Laterality Date   ABDOMINAL HYSTERECTOMY     APPENDECTOMY  1952   BUNIONECTOMY  2005   CATARACT EXTRACTION Bilateral    COLON SURGERY  2001   tumor removed   COLONOSCOPY  05/27/2017   Mild sigmoid diverticulosis. OTherwise normal colonoscopy. Colon highly redundant    vocal chord nodules removed  2010   WISDOM TOOTH EXTRACTION      CURRENT MEDICATIONS:   Current Outpatient Medications  Medication Sig Dispense Refill   albuterol (VENTOLIN HFA) 108 (90 Base) MCG/ACT inhaler Inhale 1-2 puffs into the lungs.     famotidine (PEPCID) 40 MG tablet Take 40 mg by mouth 2 (two) times daily.     ipratropium-albuterol (DUONEB) 0.5-2.5 (3) MG/3ML SOLN SMARTSIG:1 Vial(s) Every 6 Hours PRN     Lactobacillus (FLORAJEN ACIDOPHILUS) CAPS Take 1 capsule by mouth.     amLODipine (NORVASC) 5 MG tablet daily.     aspirin 81 MG tablet Take 81 mg by mouth daily.     atorvastatin (LIPITOR) 10 MG tablet Take 10 mg by mouth once a week. (Patient not taking: Reported on 08/02/2024)     docusate sodium (COLACE) 100 MG capsule Take 100 mg by mouth daily.     donepezil  (ARICEPT ) 10 MG tablet Take 10 mg by mouth at bedtime.     doxycycline (VIBRAMYCIN) 100 MG capsule Take 100 mg by mouth 2 (two) times daily.     ezetimibe (ZETIA) 10 MG tablet daily.     Fluticasone-Umeclidin-Vilant (TRELEGY ELLIPTA) 100-62.5-25 MCG/ACT AEPB Inhale into the lungs.     GEMTESA 75 MG TABS Take 1 tablet by mouth at bedtime.     levothyroxine (SYNTHROID, LEVOTHROID) 25 MCG tablet daily.  memantine (NAMENDA XR) 28 MG CP24 24 hr capsule Take 28 mg by mouth daily.     MYRBETRIQ 50 MG TB24 tablet Take 50 mg by mouth daily.     VITAMIN D, CHOLECALCIFEROL, PO Take by mouth.     Wheat Dextrin (BENEFIBER) CHEW Chew by mouth.     No current facility-administered medications for this visit.    ALLERGIES:  Allergies[1]  FAMILY HISTORY:   Family History  Problem Relation Age of Onset   Hypertension Mother    Heart disease Mother    Diabetes Father    Hypertension Father    Heart disease Father    Other Brother        DIED AS THE RESULT OF A FALL   Depression Brother    Diabetes Paternal Grandmother    Hypertension Paternal Grandmother    Heart disease Paternal Grandmother    Hypertension  Paternal Grandfather    Diabetes Paternal Grandfather    Heart disease Paternal Grandfather    Colon cancer Daughter     SOCIAL HISTORY:  The patient was born and raised in [place], [place] .  She currently lives in town with her husband of 65 years.  They have 2 children and 2 grandchildren.  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  The patient smoked a pack of cigarettes daily for 73 years before quitting 2 years ago.  She still drinks glasses of wine 2-3 times per week.  REVIEW OF SYSTEMS:  This could not be ascertained due to her severe dementia  PHYSICAL EXAM:  Blood pressure (!) 90/56, pulse 62, temperature (!) 97.5 F (36.4 C), temperature source Oral, resp. rate 14, height 5' 4 (1.626 m), weight 72 lb 3.2 oz (32.7 kg), SpO2 100%. Wt Readings from Last 3 Encounters:  11/21/24 72 lb 3.2 oz (32.7 kg)  08/02/24 74 lb (33.6 kg)  01/26/24 79 lb (35.8 kg)   Body mass index is 12.39 kg/m. Performance status (ECOG): 2 - Symptomatic, <50% confined to bed Physical Exam Constitutional:      Appearance: Normal appearance. She is not ill-appearing.     Comments: A thin, older woman who has issues with memory recollection  HENT:     Mouth/Throat:     Mouth: Mucous membranes are moist.     Pharynx: Oropharynx is clear. No oropharyngeal exudate or posterior oropharyngeal erythema.  Cardiovascular:     Rate and Rhythm: Normal rate and regular rhythm.     Heart sounds: No murmur heard.    No friction rub. No gallop.  Pulmonary:     Effort: Pulmonary effort is normal. No respiratory distress.     Breath sounds: Normal breath sounds. No wheezing, rhonchi or rales.  Abdominal:     General: Bowel sounds are normal. There is no distension.     Palpations: Abdomen is soft. There is no mass.     Tenderness: There is no abdominal tenderness.  Musculoskeletal:        General: No  swelling.     Right lower leg: No edema.     Left lower leg: No edema.  Lymphadenopathy:     Cervical: No cervical adenopathy.     Upper Body:     Right upper body: No supraclavicular or axillary adenopathy.     Left upper body: No supraclavicular or axillary adenopathy.     Lower Body: No right inguinal adenopathy. No  left inguinal adenopathy.  Skin:    General: Skin is warm.     Coloration: Skin is not jaundiced.     Findings: No lesion or rash.  Neurological:     General: No focal deficit present.     Mental Status: She is alert and oriented to person, place, and time. Mental status is at baseline.  Psychiatric:        Mood and Affect: Mood normal.        Behavior: Behavior normal.        Thought Content: Thought content normal.    LABS:      Latest Ref Rng & Units 11/21/2024    2:00 PM  CBC  WBC 4.0 - 10.5 K/uL 7.8   Hemoglobin 12.0 - 15.0 g/dL 88.7   Hematocrit 63.9 - 46.0 % 33.8   Platelets 150 - 400 K/uL 298       Latest Ref Rng & Units 11/21/2024    2:00 PM  CMP  Glucose 70 - 99 mg/dL 876   BUN 8 - 23 mg/dL 14   Creatinine 9.55 - 1.00 mg/dL 8.91   Sodium 864 - 854 mmol/L 137   Potassium 3.5 - 5.1 mmol/L 3.9   Chloride 98 - 111 mmol/L 99   CO2 22 - 32 mmol/L 26   Calcium 8.9 - 10.3 mg/dL 9.0   Total Protein 6.5 - 8.1 g/dL 5.5   Total Bilirubin 0.0 - 1.2 mg/dL 0.4   Alkaline Phos 38 - 126 U/L 71   AST 15 - 41 U/L 26   ALT 0 - 44 U/L 15    PATHOLOGY:      ASSESSMENT & PLAN:  An 89 y.o. female who I was asked to consult upon for locally advanced squamous cell esophageal carcinoma.  Normally in an individual with a good performance status, I would consider concurrent chemoradiation for definitive treatment.  However, this patient's severe dementia and suboptimal baseline health unfortunately preclude me from giving such intervention.  Her daughter, who is here with her today, understands a palliative approach needs to be considered for this patient.  While  discussing the treatment plan with the patient today, the patient was unfortunately unable to comprehend any aspects of her diagnosis or treatment plan.  When factoring in her diagnoses and her other health issues, I do believe it makes sense to bring in hospice for comfort care measures.  Her daughter is also in agreement with this being done.  No future follow-up visits will be scheduled at our clinic.  However, the patient and her family know to contact our office over these next few weeks/months if they have additional questions regarding her baseline health.  The patient and her daughter understand all the plans discussed today and are in agreement with them.  I do appreciate Carmen Fitch, MD for his new consult.   Shaarav Ripple DELENA Kerns, MD           [1]  Allergies Allergen Reactions   Latex    Other Other (See Comments)    Other   Statins Other (See Comments)    Other   "

## 2024-11-21 ENCOUNTER — Inpatient Hospital Stay

## 2024-11-21 ENCOUNTER — Inpatient Hospital Stay: Attending: Oncology | Admitting: Oncology

## 2024-11-21 ENCOUNTER — Encounter: Payer: Self-pay | Admitting: Oncology

## 2024-11-21 VITALS — BP 90/56 | HR 62 | Temp 97.5°F | Resp 14 | Ht 64.0 in | Wt 72.2 lb

## 2024-11-21 DIAGNOSIS — R634 Abnormal weight loss: Secondary | ICD-10-CM | POA: Insufficient documentation

## 2024-11-21 DIAGNOSIS — Z800 Family history of malignant neoplasm of digestive organs: Secondary | ICD-10-CM | POA: Insufficient documentation

## 2024-11-21 DIAGNOSIS — C154 Malignant neoplasm of middle third of esophagus: Secondary | ICD-10-CM | POA: Insufficient documentation

## 2024-11-21 DIAGNOSIS — C159 Malignant neoplasm of esophagus, unspecified: Secondary | ICD-10-CM

## 2024-11-21 DIAGNOSIS — F0390 Unspecified dementia without behavioral disturbance: Secondary | ICD-10-CM | POA: Insufficient documentation

## 2024-11-21 LAB — CMP (CANCER CENTER ONLY)
ALT: 15 U/L (ref 0–44)
AST: 26 U/L (ref 15–41)
Albumin: 3.4 g/dL — ABNORMAL LOW (ref 3.5–5.0)
Alkaline Phosphatase: 71 U/L (ref 38–126)
Anion gap: 11 (ref 5–15)
BUN: 14 mg/dL (ref 8–23)
CO2: 26 mmol/L (ref 22–32)
Calcium: 9 mg/dL (ref 8.9–10.3)
Chloride: 99 mmol/L (ref 98–111)
Creatinine: 1.08 mg/dL — ABNORMAL HIGH (ref 0.44–1.00)
GFR, Estimated: 49 mL/min — ABNORMAL LOW
Glucose, Bld: 123 mg/dL — ABNORMAL HIGH (ref 70–99)
Potassium: 3.9 mmol/L (ref 3.5–5.1)
Sodium: 137 mmol/L (ref 135–145)
Total Bilirubin: 0.4 mg/dL (ref 0.0–1.2)
Total Protein: 5.5 g/dL — ABNORMAL LOW (ref 6.5–8.1)

## 2024-11-21 LAB — CBC WITH DIFFERENTIAL (CANCER CENTER ONLY)
Abs Immature Granulocytes: 0.02 K/uL (ref 0.00–0.07)
Basophils Absolute: 0 K/uL (ref 0.0–0.1)
Basophils Relative: 1 %
Eosinophils Absolute: 0 K/uL (ref 0.0–0.5)
Eosinophils Relative: 0 %
HCT: 33.8 % — ABNORMAL LOW (ref 36.0–46.0)
Hemoglobin: 11.2 g/dL — ABNORMAL LOW (ref 12.0–15.0)
Immature Granulocytes: 0 %
Lymphocytes Relative: 20 %
Lymphs Abs: 1.5 K/uL (ref 0.7–4.0)
MCH: 30.1 pg (ref 26.0–34.0)
MCHC: 33.1 g/dL (ref 30.0–36.0)
MCV: 90.9 fL (ref 80.0–100.0)
Monocytes Absolute: 0.7 K/uL (ref 0.1–1.0)
Monocytes Relative: 8 %
Neutro Abs: 5.5 K/uL (ref 1.7–7.7)
Neutrophils Relative %: 71 %
Platelet Count: 298 K/uL (ref 150–400)
RBC: 3.72 MIL/uL — ABNORMAL LOW (ref 3.87–5.11)
RDW: 13.9 % (ref 11.5–15.5)
WBC Count: 7.8 K/uL (ref 4.0–10.5)
nRBC: 0 % (ref 0.0–0.2)

## 2024-12-11 DIAGNOSIS — C159 Malignant neoplasm of esophagus, unspecified: Secondary | ICD-10-CM | POA: Insufficient documentation
